# Patient Record
Sex: Male | Born: 1955 | Race: White | Hispanic: No | Marital: Married | Smoking: Current some day smoker
Health system: Southern US, Community
[De-identification: ages and names within clinical notes are randomized; demographics above are authoritative.]

## PROBLEM LIST (undated history)

## (undated) ENCOUNTER — Ambulatory Visit (INDEPENDENT_AMBULATORY_CARE_PROVIDER_SITE_OTHER): Admission: RE | Payer: Self-pay | Admitting: Internal Medicine

## (undated) DIAGNOSIS — G4730 Sleep apnea, unspecified: Secondary | ICD-10-CM

## (undated) DIAGNOSIS — E119 Type 2 diabetes mellitus without complications: Secondary | ICD-10-CM

## (undated) DIAGNOSIS — K219 Gastro-esophageal reflux disease without esophagitis: Secondary | ICD-10-CM

## (undated) DIAGNOSIS — C801 Malignant (primary) neoplasm, unspecified: Secondary | ICD-10-CM

## (undated) DIAGNOSIS — G4733 Obstructive sleep apnea (adult) (pediatric): Secondary | ICD-10-CM

## (undated) DIAGNOSIS — T7840XA Allergy, unspecified, initial encounter: Secondary | ICD-10-CM

## (undated) DIAGNOSIS — G4700 Insomnia, unspecified: Secondary | ICD-10-CM

## (undated) DIAGNOSIS — E039 Hypothyroidism, unspecified: Secondary | ICD-10-CM

## (undated) DIAGNOSIS — I10 Essential (primary) hypertension: Secondary | ICD-10-CM

## (undated) DIAGNOSIS — G43909 Migraine, unspecified, not intractable, without status migrainosus: Secondary | ICD-10-CM

## (undated) HISTORY — PX: HERNIA REPAIR: SHX51

## (undated) HISTORY — PX: TOTAL KNEE ARTHROPLASTY: SHX125

## (undated) HISTORY — DX: Obstructive sleep apnea (adult) (pediatric): G47.33

## (undated) HISTORY — DX: Allergy, unspecified, initial encounter: T78.40XA

## (undated) HISTORY — DX: Malignant (primary) neoplasm, unspecified: C80.1

## (undated) HISTORY — DX: Hypothyroidism, unspecified: E03.9

## (undated) HISTORY — DX: Insomnia, unspecified: G47.00

## (undated) HISTORY — DX: Essential (primary) hypertension: I10

## (undated) HISTORY — DX: Migraine, unspecified, not intractable, without status migrainosus: G43.909

---
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
## 1988-12-05 DIAGNOSIS — E785 Hyperlipidemia, unspecified: Secondary | ICD-10-CM

## 1988-12-05 HISTORY — PX: NASAL SEPTUM SURGERY: SHX37

## 1988-12-05 HISTORY — DX: Hyperlipidemia, unspecified: E78.5

## 1999-12-06 HISTORY — PX: PROSTATECTOMY: SHX69

## 2001-12-05 HISTORY — PX: PROSTATECTOMY: SHX69

## 2005-12-05 DIAGNOSIS — I4510 Unspecified right bundle-branch block: Secondary | ICD-10-CM

## 2005-12-05 HISTORY — PX: JOINT REPLACEMENT: SHX530

## 2005-12-05 HISTORY — DX: Unspecified right bundle-branch block: I45.10

## 2006-12-05 DIAGNOSIS — K648 Other hemorrhoids: Secondary | ICD-10-CM

## 2006-12-05 DIAGNOSIS — K5790 Diverticulosis of intestine, part unspecified, without perforation or abscess without bleeding: Secondary | ICD-10-CM

## 2006-12-05 HISTORY — DX: Other hemorrhoids: K64.8

## 2006-12-05 HISTORY — DX: Diverticulosis of intestine, part unspecified, without perforation or abscess without bleeding: K57.90

## 2010-06-08 ENCOUNTER — Ambulatory Visit
Admit: 2010-06-08 | Disposition: A | Discharge status: Home / Self Care | Payer: Self-pay | Source: Ambulatory Visit | Admitting: Family Medicine

## 2010-06-08 ENCOUNTER — Inpatient Hospital Stay
Admit: 2010-06-08 | Disposition: A | Discharge status: Home / Self Care | Payer: Self-pay | Source: Ambulatory Visit | Admitting: Family Medicine

## 2010-12-17 ENCOUNTER — Ambulatory Visit
Admit: 2010-12-17 | Disposition: A | Discharge status: Home / Self Care | Payer: Self-pay | Source: Ambulatory Visit | Admitting: Internal Medicine

## 2011-03-14 ENCOUNTER — Encounter (INDEPENDENT_AMBULATORY_CARE_PROVIDER_SITE_OTHER): Payer: Self-pay | Admitting: Internal Medicine

## 2011-03-28 ENCOUNTER — Ambulatory Visit (INDEPENDENT_AMBULATORY_CARE_PROVIDER_SITE_OTHER): Payer: BLUE CROSS/BLUE SHIELD | Admitting: Internal Medicine

## 2011-05-23 ENCOUNTER — Encounter (INDEPENDENT_AMBULATORY_CARE_PROVIDER_SITE_OTHER): Payer: Self-pay | Admitting: Internal Medicine

## 2011-05-23 ENCOUNTER — Other Ambulatory Visit (INDEPENDENT_AMBULATORY_CARE_PROVIDER_SITE_OTHER): Payer: Self-pay | Admitting: Internal Medicine

## 2011-05-23 DIAGNOSIS — R059 Cough, unspecified: Secondary | ICD-10-CM

## 2011-05-23 MED ORDER — CHLORPHENIRAMINE-HYDROCODONE 8-10 MG/5ML PO LQCR
5.00 mL | Freq: Two times a day (BID) | Status: DC | PRN
Start: 2011-05-23 — End: 2012-08-03

## 2011-05-23 NOTE — Telephone Encounter (Signed)
Prescription is at [facility] to pick up

## 2011-06-29 ENCOUNTER — Encounter (INDEPENDENT_AMBULATORY_CARE_PROVIDER_SITE_OTHER): Payer: Self-pay | Admitting: Internal Medicine

## 2011-06-30 ENCOUNTER — Ambulatory Visit (INDEPENDENT_AMBULATORY_CARE_PROVIDER_SITE_OTHER): Payer: BLUE CROSS/BLUE SHIELD | Admitting: Internal Medicine

## 2011-06-30 ENCOUNTER — Encounter (INDEPENDENT_AMBULATORY_CARE_PROVIDER_SITE_OTHER): Payer: Self-pay | Admitting: Internal Medicine

## 2011-06-30 DIAGNOSIS — E039 Hypothyroidism, unspecified: Secondary | ICD-10-CM

## 2011-06-30 DIAGNOSIS — G4733 Obstructive sleep apnea (adult) (pediatric): Secondary | ICD-10-CM

## 2011-06-30 DIAGNOSIS — I4510 Unspecified right bundle-branch block: Secondary | ICD-10-CM | POA: Insufficient documentation

## 2011-06-30 DIAGNOSIS — I10 Essential (primary) hypertension: Secondary | ICD-10-CM

## 2011-06-30 DIAGNOSIS — E785 Hyperlipidemia, unspecified: Secondary | ICD-10-CM

## 2011-06-30 DIAGNOSIS — E669 Obesity, unspecified: Secondary | ICD-10-CM

## 2011-06-30 DIAGNOSIS — E119 Type 2 diabetes mellitus without complications: Secondary | ICD-10-CM | POA: Insufficient documentation

## 2011-06-30 DIAGNOSIS — G4700 Insomnia, unspecified: Secondary | ICD-10-CM

## 2011-06-30 LAB — POCT URINALYSIS DIPSTIX (10)(MULTI-TEST)
Bilirubin, UA POCT: NEGATIVE
Blood, UA POCT: NEGATIVE
Glucose, UA POCT: NEGATIVE mg/dL
Ketones, UA POCT: NEGATIVE
Nitrite, UA POCT: NEGATIVE
POCT Leukocytes, UA: NEGATIVE
POCT Protein, UA: NEGATIVE
POCT Spec Gravity, UA: 1.015 (ref 1.001–1.035)
POCT Urobilinogen,Urine: 0.2 mg/dL (ref ?–1)
POCT pH, UA: 6 (ref 5–8)

## 2011-06-30 MED ORDER — ZOLPIDEM TARTRATE 10 MG PO TABS
10.00 mg | ORAL_TABLET | Freq: Every evening | ORAL | Status: DC | PRN
Start: 2011-06-30 — End: 2011-12-23

## 2011-06-30 MED ORDER — INSULIN PEN NEEDLE 32G X 6 MM MISC
Status: DC
Start: 2011-06-30 — End: 2012-02-17

## 2011-06-30 MED ORDER — ZOLPIDEM TARTRATE 10 MG PO TABS
10.00 mg | ORAL_TABLET | Freq: Every evening | ORAL | Status: DC | PRN
Start: 2011-06-30 — End: 2011-06-30

## 2011-06-30 NOTE — Progress Notes (Signed)
Subjective: DM, HTN, Hyperlipidemia F?u       Patient ID: Carl Gibbs is a 55 y.o. male.    HPI  Came in for DM, HTN, hyperlipidemia f/u. His FBS, BP have been stable. He tried diet and exercise to loose weight but never been successful. Interested in bariatric surgery, especially lap band surgery.      The following portions of the patient's history were reviewed and updated as appropriate: allergies, current medications, past family history, past medical history, past social history, past surgical history and problem list.      Past Medical History   Diagnosis Date   . Internal hemorrhoids 2008   . Diverticulosis 2008     by colonoscopy   . Allergy      ragweed,leaf,mold,dust,seasonal,beestings   . Diabetes mellitus 2006     type 2   . Cancer 2001-2002     prostate   . Hypertension    . Hyperlipidemia 1990   . Migraine syndrome    . Hypothyroidism    . Insomnia    . OSA (obstructive sleep apnea)    . Incomplete RBBB 2007       Past Surgical History   Procedure Date   . Hernia repair 1970's   . Prostatectomy 2001   . Nasal septum surgery 1990   . Joint replacement 2007     knee replacements,both       History     Social History   . Marital Status: Married     Spouse Name: N/A     Number of Children: N/A   . Years of Education: N/A     Occupational History   . Not on file.     Social History Main Topics   . Smoking status: Never Smoker    . Smokeless tobacco: Never Used   . Alcohol Use: Yes   . Drug Use: No   . Sexually Active:      Other Topics Concern   . Not on file     Social History Narrative   . No narrative on file       History reviewed. No pertinent family history.    Current outpatient prescriptions:atorvastatin (LIPITOR) 40 MG tablet, Take 40 mg by mouth daily.  , Disp: , Rfl: ;  fexofenadine (ALLEGRA) 30 MG tablet, Take 30 mg by mouth 2 (two) times daily.  , Disp: , Rfl: ;  fluticasone (FLONASE) 50 MCG/ACT nasal spray, 1 spray by Nasal route daily.  , Disp: , Rfl: ;  Lancets Thin MISC, by Does not  apply route.  , Disp: , Rfl:   levothyroxine (SYNTHROID, LEVOTHROID) 100 MCG tablet, Take 100 mcg by mouth daily.  , Disp: , Rfl: ;  Liraglutide (VICTOZA) 18 MG/3ML SOLN, Inject into the skin.  , Disp: , Rfl: ;  olmesartan (BENICAR) 20 MG tablet, Take 20 mg by mouth daily.  , Disp: , Rfl: ;  propranolol (INDERAL) 10 MG tablet, Take 10 mg by mouth as needed.  , Disp: , Rfl:   zolpidem (AMBIEN) 10 MG tablet, Take 1 tablet (10 mg total) by mouth nightly as needed., Disp: 90 tablet, Rfl: 3;  DISCONTD: zolpidem (AMBIEN) 10 MG tablet, Take 10 mg by mouth nightly as needed.  , Disp: , Rfl: ;  DISCONTD: zolpidem (AMBIEN) 10 MG tablet, Take 1 tablet (10 mg total) by mouth nightly as needed., Disp: 30 tablet, Rfl: 0  CHLORPHENIRAMINE-HYDROCODONE 8-10 MG/5ML PO LQCR, Take 5 mLs by mouth every  12 (twelve) hours as needed., Disp: 115 mL, Rfl: 0;  Insulin Pen Needle 32G X 6 MM MISC, Once a day with Victoza, Disp: 100 each, Rfl: 6      Review of Systems   Constitutional: Negative for fever, chills, diaphoresis, activity change, appetite change, fatigue and unexpected weight change.   Respiratory: Negative for cough, choking, chest tightness, shortness of breath, wheezing and stridor.    Cardiovascular: Negative for chest pain, palpitations and leg swelling.   Gastrointestinal: Negative for nausea, vomiting, abdominal pain, diarrhea, constipation, blood in stool, abdominal distention, anal bleeding and rectal pain.   Neurological: Negative for dizziness, weakness, light-headedness, numbness and headaches.   Hematological: Negative for adenopathy. Does not bruise/bleed easily.           Objective:    Physical Exam   Constitutional: He is oriented to person, place, and time. He appears well-developed and well-nourished. No distress.   HENT:   Head: Normocephalic and atraumatic.   Neck: No JVD present.   Cardiovascular: Normal rate, regular rhythm, normal heart sounds and intact distal pulses.  Exam reveals no gallop and no friction  rub.    No murmur heard.  Pulmonary/Chest: Effort normal and breath sounds normal. No respiratory distress. He has no wheezes. He has no rales. He exhibits no tenderness.   Abdominal: Soft. Bowel sounds are normal. He exhibits no distension and no mass. There is no tenderness. There is no rebound and no guarding.   Musculoskeletal: He exhibits no edema.   Neurological: He is alert and oriented to person, place, and time.   Skin: Skin is warm. He is not diaphoretic.   Psychiatric: His behavior is normal.           Assessment:       1. Hyperlipidemia    2. Diabetes mellitus type II    3. Hypothyroidism    4. Hypertension    5. Insomnia    6. OSA (obstructive sleep apnea)    7. Obesity          Plan:       1. Tests  Orders Placed This Encounter   Procedures   . Lipid panel   . Comprehensive metabolic panel   . TSH   . T4, free   . CK   . POCT urinalysis dipstick   2. Refill of Victoza needle, Ambien  3. Ref Bariatric surgeon  4. Continue current medicines  5. F/U: q 3 months and pending test results

## 2011-07-01 ENCOUNTER — Other Ambulatory Visit (INDEPENDENT_AMBULATORY_CARE_PROVIDER_SITE_OTHER): Payer: Self-pay | Admitting: Internal Medicine

## 2011-07-01 DIAGNOSIS — E119 Type 2 diabetes mellitus without complications: Secondary | ICD-10-CM

## 2011-07-01 DIAGNOSIS — K7689 Other specified diseases of liver: Secondary | ICD-10-CM

## 2011-07-01 LAB — LIPID PANEL
Cholesterol / HDL Ratio: 3.4 ratio units (ref 0.0–5.0)
Cholesterol: 131 mg/dL (ref 100–199)
HDL: 38 mg/dL — ABNORMAL LOW (ref >39)
LDL Calculated: 81 mg/dL (ref 0–99)
Triglycerides: 62 mg/dL (ref 0–149)
VLDL Calculated: 12 mg/dL (ref 5–40)

## 2011-07-01 LAB — COMPREHENSIVE METABOLIC PANEL
ALT: 83 IU/L — ABNORMAL HIGH (ref 0–55)
AST (SGOT): 47 IU/L — ABNORMAL HIGH (ref 0–40)
Albumin/Globulin Ratio: 1.4 (ref 1.1–2.5)
Albumin: 4 g/dL (ref 3.5–5.5)
Alkaline Phosphatase: 62 IU/L (ref 25–150)
BUN / Creatinine Ratio: 24 — ABNORMAL HIGH (ref 9–20)
BUN: 17 mg/dL (ref 6–24)
Bilirubin, Total: 0.5 mg/dL (ref 0.0–1.2)
CO2: 22 mmol/L (ref 20–32)
Calcium: 8.8 mg/dL (ref 8.7–10.2)
Chloride: 103 mmol/L (ref 97–108)
Creatinine: 0.7 mg/dL — ABNORMAL LOW (ref 0.76–1.27)
EGFR: 121 mL/min/{1.73_m2} (ref >59)
GFR Modification of Diet in Renal Disease Non-African American: 105 mL/min/{1.73_m2} (ref >59)
Globulin, Total: 2.8 g/dL (ref 1.5–4.5)
Glucose: 93 mg/dL (ref 65–99)
Potassium: 4.4 mmol/L (ref 3.5–5.2)
Protein, Total: 6.8 g/dL (ref 6.0–8.5)
Sodium: 139 mmol/L (ref 134–144)

## 2011-07-01 LAB — CK: Creatinine Kinase, Total: 96 U/L (ref 24–204)

## 2011-07-01 LAB — TSH: TSH: 0.918 u[IU]/mL (ref 0.450–4.500)

## 2011-07-01 LAB — T4, FREE: T4 Free: 1.23 ng/dL (ref 0.82–1.77)

## 2011-07-02 LAB — HEMOGLOBIN A1C: Hemoglobin A1C: 6 % — ABNORMAL HIGH (ref 4.8–5.6)

## 2011-07-04 ENCOUNTER — Encounter (INDEPENDENT_AMBULATORY_CARE_PROVIDER_SITE_OTHER): Payer: Self-pay | Admitting: Internal Medicine

## 2011-07-18 ENCOUNTER — Encounter (INDEPENDENT_AMBULATORY_CARE_PROVIDER_SITE_OTHER): Payer: Self-pay | Admitting: Internal Medicine

## 2011-07-18 ENCOUNTER — Ambulatory Visit (INDEPENDENT_AMBULATORY_CARE_PROVIDER_SITE_OTHER): Payer: BLUE CROSS/BLUE SHIELD | Admitting: Internal Medicine

## 2011-07-18 VITALS — BP 121/82 | HR 71 | Temp 98.3°F | Ht 64.0 in | Wt 226.4 lb

## 2011-07-18 DIAGNOSIS — E669 Obesity, unspecified: Secondary | ICD-10-CM

## 2011-07-18 DIAGNOSIS — G4730 Sleep apnea, unspecified: Secondary | ICD-10-CM

## 2011-07-18 NOTE — Progress Notes (Signed)
Subjective: pre op medical clearance.        Patient ID: Carl Gibbs is a 55 y.o. male.    HPI  Came in for gastric sleeve surgery consultation. He was required to have 4 months of pre op program including Nutritional, Gastroenterology and Psychiatry consultation, EGD and Sleep study. Required to loose 20 lbs prior to the surgery. Requests a letter to state that he is a candidate for the surgery.      CPAP setting was lowered in 03/2011. Since then he ca not sleep well as the pressure was too low. Requests pressure adjustment.    Past Medical History   Diagnosis Date   . Internal hemorrhoids 2008   . Diverticulosis 2008     by colonoscopy   . Allergy      ragweed,leaf,mold,dust,seasonal,beestings   . Diabetes mellitus 2006     type 2   . Cancer 2001-2002     prostate   . Hypertension    . Hyperlipidemia 1990   . Migraine syndrome    . Hypothyroidism    . Insomnia    . OSA (obstructive sleep apnea)    . Incomplete RBBB 2007       Past Surgical History   Procedure Date   . Hernia repair 1970's   . Prostatectomy 2001   . Nasal septum surgery 1990   . Joint replacement 2007     knee replacements,both       History     Social History   . Marital Status: Married     Spouse Name: N/A     Number of Children: N/A   . Years of Education: N/A     Occupational History   . Not on file.     Social History Main Topics   . Smoking status: Never Smoker    . Smokeless tobacco: Never Used   . Alcohol Use: Yes   . Drug Use: No   . Sexually Active:      Other Topics Concern   . Not on file     Social History Narrative   . No narrative on file       History reviewed. No pertinent family history.    Current outpatient prescriptions:amoxicillin (AMOXIL) 500 MG capsule, , Disp: , Rfl: ;  atorvastatin (LIPITOR) 40 MG tablet, Take 40 mg by mouth daily.  , Disp: , Rfl: ;  chlorhexidine (PERIDEX) 0.12 % solution, , Disp: , Rfl: ;  CHLORPHENIRAMINE-HYDROCODONE 8-10 MG/5ML PO LQCR, Take 5 mLs by mouth every 12 (twelve) hours as needed., Disp:  115 mL, Rfl: 0;  fexofenadine (ALLEGRA) 30 MG tablet, Take 30 mg by mouth 2 (two) times daily.  , Disp: , Rfl:   fluticasone (FLONASE) 50 MCG/ACT nasal spray, 1 spray by Nasal route daily.  , Disp: , Rfl: ;  HYDROcodone-ibuprofen (VICOPROFEN) 7.5-200 MG per tablet, , Disp: , Rfl: ;  ibuprofen (ADVIL,MOTRIN) 800 MG tablet, , Disp: , Rfl: ;  Insulin Pen Needle 32G X 6 MM MISC, Once a day with Victoza, Disp: 100 each, Rfl: 6;  Lancets Thin MISC, by Does not apply route.  , Disp: , Rfl:   levothyroxine (SYNTHROID, LEVOTHROID) 100 MCG tablet, Take 100 mcg by mouth daily.  , Disp: , Rfl: ;  Liraglutide (VICTOZA) 18 MG/3ML SOLN, Inject into the skin.  , Disp: , Rfl: ;  olmesartan (BENICAR) 20 MG tablet, Take 20 mg by mouth daily.  , Disp: , Rfl: ;  PATANASE 0.6 %  SOLN, , Disp: , Rfl: ;  propranolol (INDERAL) 10 MG tablet, Take 10 mg by mouth as needed.  , Disp: , Rfl: ;  SF 1.1 % GEL, , Disp: , Rfl:   zolpidem (AMBIEN) 10 MG tablet, Take 1 tablet (10 mg total) by mouth nightly as needed., Disp: 90 tablet, Rfl: 3         The following portions of the patient's history were reviewed and updated as appropriate: allergies, current medications, past family history, past medical history, past social history, past surgical history and problem list.    Review of Systems   Constitutional: Negative for fever, chills, diaphoresis, activity change, appetite change, fatigue and unexpected weight change.   Respiratory: Negative for apnea, cough, choking, chest tightness, shortness of breath, wheezing and stridor.    Cardiovascular: Negative for chest pain, palpitations and leg swelling.   Gastrointestinal: Negative.    Neurological: Negative for dizziness, light-headedness and headaches.   Psychiatric/Behavioral: Positive for sleep disturbance.           Objective:    Physical Exam   Constitutional: He is oriented to person, place, and time. He appears well-developed and well-nourished. No distress.   HENT:   Head: Normocephalic and  atraumatic.   Neurological: He is alert and oriented to person, place, and time.   Skin: He is not diaphoretic.   Psychiatric: He has a normal mood and affect. His behavior is normal.           Assessment:       1. Obesity    2. Sleep apnea          Plan:       1. Contact Pam, [phone] a surgical coordinator to release a form to sign  2. Contact Sleep study to review the pressure  3. Continue all the medications  4. F/U: pt will be required medical clearance examination and BW

## 2011-07-19 ENCOUNTER — Telehealth (INDEPENDENT_AMBULATORY_CARE_PROVIDER_SITE_OTHER): Payer: Self-pay | Admitting: Internal Medicine

## 2011-07-19 NOTE — Telephone Encounter (Signed)
Spoke to pt. Regarding CPAP, pt. Stated we need to fax an order to [facility] to adjust the settings of his machine. Pt. Stated last night he adjusted the air flow to 13 and was able to sleep well with feeling tired or headache in the morning. Pt. Stated he wants the setting to 12, and does not want to have another sleep study. [facility] # [id number] [phone] fax [phone].Thanks.

## 2011-07-19 NOTE — Telephone Encounter (Signed)
Fax [facility] to [facility]

## 2011-07-19 NOTE — Telephone Encounter (Signed)
Order is at [facility]

## 2011-08-01 ENCOUNTER — Other Ambulatory Visit (INDEPENDENT_AMBULATORY_CARE_PROVIDER_SITE_OTHER): Payer: Self-pay | Admitting: Internal Medicine

## 2011-08-01 DIAGNOSIS — K7689 Other specified diseases of liver: Secondary | ICD-10-CM

## 2011-08-01 DIAGNOSIS — E119 Type 2 diabetes mellitus without complications: Secondary | ICD-10-CM

## 2011-08-06 DIAGNOSIS — K449 Diaphragmatic hernia without obstruction or gangrene: Secondary | ICD-10-CM

## 2011-08-06 HISTORY — DX: Diaphragmatic hernia without obstruction or gangrene: K44.9

## 2011-08-18 ENCOUNTER — Encounter (INDEPENDENT_AMBULATORY_CARE_PROVIDER_SITE_OTHER): Payer: Self-pay | Admitting: Internal Medicine

## 2011-09-02 ENCOUNTER — Ambulatory Visit: Payer: Self-pay

## 2011-09-02 ENCOUNTER — Encounter (INDEPENDENT_AMBULATORY_CARE_PROVIDER_SITE_OTHER): Payer: Self-pay | Admitting: Internal Medicine

## 2011-09-02 HISTORY — PX: ESOPHAGOGASTRODUODENOSCOPY: SHX1529

## 2011-09-05 DIAGNOSIS — K2210 Ulcer of esophagus without bleeding: Secondary | ICD-10-CM

## 2011-09-05 HISTORY — DX: Ulcer of esophagus without bleeding: K22.10

## 2011-09-09 ENCOUNTER — Encounter (INDEPENDENT_AMBULATORY_CARE_PROVIDER_SITE_OTHER): Payer: Self-pay | Admitting: Internal Medicine

## 2011-09-14 ENCOUNTER — Encounter (INDEPENDENT_AMBULATORY_CARE_PROVIDER_SITE_OTHER): Payer: Self-pay | Admitting: Internal Medicine

## 2011-09-27 ENCOUNTER — Encounter (INDEPENDENT_AMBULATORY_CARE_PROVIDER_SITE_OTHER): Payer: Self-pay | Admitting: Internal Medicine

## 2011-09-29 ENCOUNTER — Encounter (INDEPENDENT_AMBULATORY_CARE_PROVIDER_SITE_OTHER): Payer: Self-pay | Admitting: Internal Medicine

## 2011-09-30 ENCOUNTER — Ambulatory Visit (INDEPENDENT_AMBULATORY_CARE_PROVIDER_SITE_OTHER): Payer: BLUE CROSS/BLUE SHIELD | Admitting: Internal Medicine

## 2011-09-30 ENCOUNTER — Encounter (INDEPENDENT_AMBULATORY_CARE_PROVIDER_SITE_OTHER): Payer: Self-pay | Admitting: Internal Medicine

## 2011-09-30 VITALS — BP 99/69 | HR 73 | Temp 98.8°F | Ht 64.0 in | Wt 227.1 lb

## 2011-09-30 DIAGNOSIS — K5730 Diverticulosis of large intestine without perforation or abscess without bleeding: Secondary | ICD-10-CM | POA: Insufficient documentation

## 2011-09-30 DIAGNOSIS — Z01818 Encounter for other preprocedural examination: Secondary | ICD-10-CM

## 2011-09-30 DIAGNOSIS — K449 Diaphragmatic hernia without obstruction or gangrene: Secondary | ICD-10-CM | POA: Insufficient documentation

## 2011-09-30 DIAGNOSIS — K2210 Ulcer of esophagus without bleeding: Secondary | ICD-10-CM | POA: Insufficient documentation

## 2011-09-30 DIAGNOSIS — M25521 Pain in right elbow: Secondary | ICD-10-CM

## 2011-09-30 DIAGNOSIS — I10 Essential (primary) hypertension: Secondary | ICD-10-CM

## 2011-09-30 DIAGNOSIS — E669 Obesity, unspecified: Secondary | ICD-10-CM

## 2011-09-30 DIAGNOSIS — E119 Type 2 diabetes mellitus without complications: Secondary | ICD-10-CM

## 2011-09-30 DIAGNOSIS — M25529 Pain in unspecified elbow: Secondary | ICD-10-CM

## 2011-09-30 DIAGNOSIS — E785 Hyperlipidemia, unspecified: Secondary | ICD-10-CM

## 2011-09-30 MED ORDER — NAPROXEN 375 MG PO TBEC
375.00 mg | DELAYED_RELEASE_TABLET | Freq: Two times a day (BID) | ORAL | Status: DC
Start: 2011-09-30 — End: 2011-10-21

## 2011-09-30 NOTE — Progress Notes (Signed)
Subjective: medical clearance , right elbow pain       Patient ID: Carl Gibbs is a 55 y.o. male.    HPI  Came in for a medical clearance for a bariatric surgery on 11/16/11. He had all clearance including EGD, Sleep study, nutrition consultation and psychiatric consultation. Needs to have cardiology consultation, CXR, GB USG, ECG and BW at [facility]. No chest pain, palpitation, dyspnea or headaches    C/o right elbow pain for several days. No injury, swelling, numbness or weakness. The pain is worse when lifting or carrying.     The following portions of the patient's history were reviewed and updated as appropriate: allergies, current medications, past family history, past medical history, past social history, past surgical history and problem list.      Past Medical History   Diagnosis Date   . Internal hemorrhoids 2008   . Diverticulosis 2008     by colonoscopy   . Allergy      ragweed,leaf,mold,dust,seasonal,beestings   . Diabetes mellitus 2006     type 2   . Cancer 2001-2002     prostate   . Hypertension    . Hyperlipidemia 1990   . Migraine syndrome    . Hypothyroidism    . Insomnia    . OSA (obstructive sleep apnea)    . Incomplete RBBB 2007   . Hiatal hernia 08/2011   . Erosive esophagitis 09/2011       Past Surgical History   Procedure Date   . Hernia repair 1970's   . Prostatectomy 2001   . Nasal septum surgery 1990   . Joint replacement 2007     knee replacements,both   . Esophagogastroduodenoscopy 09/02/11     hiatal hernia`       History reviewed. No pertinent family history.    History     Social History   . Marital Status: Married     Spouse Name: N/A     Number of Children: N/A   . Years of Education: N/A     Occupational History   . Not on file.     Social History Main Topics   . Smoking status: Never Smoker    . Smokeless tobacco: Never Used   . Alcohol Use: Yes   . Drug Use: No   . Sexually Active:      Other Topics Concern   . Not on file     Social History Narrative   . No narrative on  file       Current outpatient prescriptions:atorvastatin (LIPITOR) 40 MG tablet, Take 40 mg by mouth daily.  , Disp: , Rfl: ;  fexofenadine (ALLEGRA) 30 MG tablet, Take 30 mg by mouth 2 (two) times daily.  , Disp: , Rfl: ;  FLUZONE injection, , Disp: , Rfl: ;  Insulin Pen Needle 32G X 6 MM MISC, Once a day with Victoza, Disp: 100 each, Rfl: 6;  Lancets Thin MISC, by Does not apply route.  , Disp: , Rfl:   levothyroxine (SYNTHROID, LEVOTHROID) 100 MCG tablet, Take 100 mcg by mouth daily.  , Disp: , Rfl: ;  Liraglutide (VICTOZA) 18 MG/3ML SOLN, Inject into the skin.  , Disp: , Rfl: ;  olmesartan (BENICAR) 20 MG tablet, Take 20 mg by mouth daily.  , Disp: , Rfl: ;  omeprazole (PRILOSEC) 40 MG capsule, , Disp: , Rfl: ;  OMNARIS 50 MCG/ACT nasal spray, , Disp: , Rfl: ;  PATANASE 0.6 % SOLN, ,  Disp: , Rfl:   propranolol (INDERAL) 10 MG tablet, Take 10 mg by mouth as needed.  , Disp: , Rfl: ;  zolpidem (AMBIEN) 10 MG tablet, Take 1 tablet (10 mg total) by mouth nightly as needed., Disp: 90 tablet, Rfl: 3;  amoxicillin (AMOXIL) 500 MG capsule, , Disp: , Rfl: ;  chlorhexidine (PERIDEX) 0.12 % solution, , Disp: , Rfl: ;  CHLORPHENIRAMINE-HYDROCODONE 8-10 MG/5ML PO LQCR, Take 5 mLs by mouth every 12 (twelve) hours as needed., Disp: 115 mL, Rfl: 0  fluticasone (FLONASE) 50 MCG/ACT nasal spray, 1 spray by Nasal route daily.  , Disp: , Rfl: ;  HYDROcodone-ibuprofen (VICOPROFEN) 7.5-200 MG per tablet, , Disp: , Rfl: ;  ibuprofen (ADVIL,MOTRIN) 800 MG tablet, , Disp: , Rfl: ;  Naproxen (NAPROXEN) 375 MG TBEC, Take 375 mg by mouth 2 (two) times daily., Disp: 60 each, Rfl: 0;  SF 1.1 % GEL, , Disp: , Rfl:       Review of Systems   Constitutional: Negative for fever, chills, diaphoresis, activity change, appetite change, fatigue and unexpected weight change.   HENT: Negative for neck pain and neck stiffness.    Respiratory: Negative for apnea, cough, choking, chest tightness, shortness of breath, wheezing and stridor.     Cardiovascular: Negative for chest pain, palpitations and leg swelling.   Gastrointestinal: Negative.    Musculoskeletal: Positive for arthralgias. Negative for myalgias, back pain, joint swelling and gait problem.        Right elbow pain   Neurological: Negative for dizziness, weakness, light-headedness, numbness and headaches.   Hematological: Negative for adenopathy.   Psychiatric/Behavioral: Positive for disturbed wake/sleep cycle. The patient is not nervous/anxious.         Sleep apnea on CPAP           Objective:    Physical Exam  BP 99/69  Pulse 73  Temp(Src) 98.8 F (37.1 C) (Oral)  Ht 1.626 m (5\' 4" )  Wt 103.023 kg (227 lb 2 oz)  BMI 38.99 kg/m2    General Appearance:    Alert, cooperative, no distress, appears stated age   Head:    Normocephalic, without obvious abnormality, atraumatic   Eyes:    PERRL, conjunctiva/corneas clear, EOM's intact, fundi     benign, both eyes        Ears:    Normal TM's and external ear canals, both ears   Nose:   Nares normal, septum midline, mucosa normal, no drainage    or sinus tenderness   Throat:   Lips, mucosa, and tongue normal; teeth and gums normal   Neck:   Supple, symmetrical, trachea midline, no adenopathy;        thyroid:  No enlargement/tenderness/nodules; no carotid    bruit or JVD   Back:     Symmetric, no curvature, ROM normal, no CVA tenderness   Lungs:     Clear to auscultation bilaterally, respirations unlabored   Chest wall:    No tenderness or deformity   Heart:    Regular rate and rhythm, S1 and S2 normal, no murmur, rub    or gallop   Abdomen:     Soft, non-tender, bowel sounds active all four quadrants,     no masses, no organomegaly   Extremities:   Atraumatic, no cyanosis or edema, tenderness at right medial epicondyle area   Pulses:   2+ and symmetric all extremities   Skin:   Skin color, texture, turgor normal, no rashes or lesions   Lymph  nodes:   Cervical, supraclavicular, and axillary nodes normal   Neurologic:   CNII-XII intact. Normal  strength, sensation and reflexes       throughout         Assessment:       1. Preoperative examination, unspecified    2. Diabetes mellitus type II    3. Benign hypertension    4. Hyperlipidemia    5. Obesity    6. Right elbow pain          Plan:       1. He is medically cleared on examination to proceed with a surgery.   2. F/u his pre ope test results  3. Naproxen EC 375 mg bid prn for elbow pain  4. Avoid heavy lifting or carrying  5. F/U: q 3 months.

## 2011-10-06 DIAGNOSIS — Z136 Encounter for screening for cardiovascular disorders: Secondary | ICD-10-CM

## 2011-10-06 HISTORY — DX: Encounter for screening for cardiovascular disorders: Z13.6

## 2011-10-19 ENCOUNTER — Encounter (INDEPENDENT_AMBULATORY_CARE_PROVIDER_SITE_OTHER): Payer: Self-pay | Admitting: Internal Medicine

## 2011-10-20 ENCOUNTER — Encounter (INDEPENDENT_AMBULATORY_CARE_PROVIDER_SITE_OTHER): Payer: Self-pay | Admitting: Internal Medicine

## 2011-10-20 ENCOUNTER — Ambulatory Visit: Admit: 2011-10-20 | Disposition: A | Discharge status: Home / Self Care | Payer: Self-pay | Source: Ambulatory Visit

## 2011-10-20 LAB — CBC AND DIFFERENTIAL
Basophils Absolute Automated: 0.03 10*3/uL (ref 0.00–0.20)
Basophils Automated: 0 % (ref 0–2)
Eosinophils Absolute Automated: 0.37 10*3/uL (ref 0.00–0.70)
Eosinophils Automated: 5 % (ref 0–5)
Hematocrit: 47.4 % (ref 42.0–52.0)
Hgb: 16.1 g/dL (ref 13.0–17.0)
Immature Granulocytes Absolute: 0.01 10*3/uL
Immature Granulocytes: 0 % (ref 0–1)
Lymphocytes Absolute Automated: 1.61 10*3/uL (ref 0.50–4.40)
Lymphocytes Automated: 20 % (ref 15–41)
MCH: 29.5 pg (ref 28.0–32.0)
MCHC: 34 g/dL (ref 32.0–36.0)
MCV: 87 fL (ref 80.0–100.0)
MPV: 9.2 fL — ABNORMAL LOW (ref 9.4–12.3)
Monocytes Absolute Automated: 0.87 10*3/uL (ref 0.00–1.20)
Monocytes: 11 % (ref 0–11)
Neutrophils Absolute: 5.21 10*3/uL (ref 1.80–8.10)
Neutrophils: 64 % (ref 52–75)
Nucleated RBC: 0 /100 WBC
Platelets: 223 10*3/uL (ref 140–400)
RBC: 5.45 10*6/uL (ref 4.70–6.00)
RDW: 13 % (ref 12–15)
WBC: 8.09 10*3/uL (ref 3.50–10.80)

## 2011-10-20 LAB — COMPREHENSIVE METABOLIC PANEL
ALT: 62 U/L — ABNORMAL HIGH (ref 7–56)
AST (SGOT): 36 U/L (ref 5–40)
Albumin/Globulin Ratio: 1.3 (ref 1.1–1.8)
Albumin: 4.4 g/dL (ref 3.7–5.1)
Alkaline Phosphatase: 83 U/L (ref 43–122)
Anion Gap: 14 (ref 5.0–15.0)
BUN: 15 mg/dL (ref 7–21)
Bilirubin, Total: 0.6 mg/dL (ref 0.2–1.3)
CO2: 28 mEq/L (ref 22–31)
Calcium: 9.5 mg/dL (ref 8.6–10.2)
Chloride: 102 mEq/L (ref 98–107)
Creatinine: 0.8 mg/dL (ref 0.5–1.4)
Globulin: 3.3 g/dL (ref 2.0–3.7)
Glucose: 101 mg/dL — ABNORMAL HIGH (ref 70–100)
Potassium: 4.9 mEq/L (ref 3.6–5.0)
Protein, Total: 7.7 g/dL (ref 6.0–8.0)
Sodium: 144 mEq/L — ABNORMAL HIGH (ref 136–143)

## 2011-10-20 LAB — LIPID PANEL
Cholesterol / HDL Ratio: 5.2 Index
Cholesterol: 140 mg/dL (ref 0–199)
HDL: 27 mg/dL — ABNORMAL LOW (ref >40)
LDL Calculated: 91 mg/dL (ref 0–99)
Triglycerides: 108 mg/dL (ref 34–149)
VLDL Calculated: 22 mg/dL (ref 10–40)

## 2011-10-20 LAB — URINALYSIS WITH MICROSCOPIC
Bilirubin, UA: NEGATIVE
Blood, UA: NEGATIVE
Glucose, UA: NEGATIVE
Ketones UA: NEGATIVE
Leukocyte Esterase, UA: NEGATIVE
Nitrite, UA: NEGATIVE
Protein, UR: NEGATIVE
Specific Gravity UA POCT: 1.015 (ref 1.001–1.035)
Urine pH: 6 (ref 5.0–8.0)
Urobilinogen, UA: NORMAL mg/dL

## 2011-10-20 LAB — APTT: PTT: 28 s (ref 23–37)

## 2011-10-20 LAB — GFR: EGFR: >60

## 2011-10-20 LAB — H. PYLORI ANTIBODY, IGG: Helicobacter Pylori Antibody: NEGATIVE

## 2011-10-20 LAB — TSH: TSH: 1.55 u[IU]/mL (ref 0.35–4.94)

## 2011-10-20 LAB — PT/INR
PT INR: 1 (ref 0.9–1.1)
PT: 13.6 s (ref 12.6–15.0)

## 2011-10-20 LAB — HEMOLYSIS INDEX: Hemolysis Index: 11 Index — ABNORMAL HIGH (ref 0–9)

## 2011-10-21 ENCOUNTER — Other Ambulatory Visit (INDEPENDENT_AMBULATORY_CARE_PROVIDER_SITE_OTHER): Payer: Self-pay | Admitting: Internal Medicine

## 2011-10-21 LAB — VITAMIN D-25 HYDROXY (D2/D3/TOTAL)
Vitamin D 25-OH D2: <4 ng/mL
Vitamin D 25-OH D3: 27 ng/mL
Vitamin D 25-OH Total: 27 ng/mL — ABNORMAL LOW (ref 30–100)

## 2011-11-16 ENCOUNTER — Inpatient Hospital Stay
Admission: RE | Admit: 2011-11-16 | Disposition: A | Discharge status: Home / Self Care | Payer: Self-pay | Source: Ambulatory Visit | Attending: Surgery | Admitting: Surgery

## 2011-11-16 HISTORY — PX: BARIATRIC SURGERY: SHX1103

## 2011-11-16 LAB — TYPE AND SCREEN
AB Screen Gel: NEGATIVE
ABO Rh: O NEG

## 2011-11-17 LAB — COMPREHENSIVE METABOLIC PANEL
ALT: 94 U/L — ABNORMAL HIGH (ref 7–56)
AST (SGOT): 55 U/L — ABNORMAL HIGH (ref 5–40)
Albumin/Globulin Ratio: 1.2 (ref 1.1–1.8)
Albumin: 3.4 g/dL — ABNORMAL LOW (ref 3.7–5.1)
Alkaline Phosphatase: 59 U/L (ref 43–122)
Anion Gap: 13 (ref 5.0–15.0)
BUN: 12 mg/dL (ref 7–21)
Bilirubin, Total: 0.4 mg/dL (ref 0.2–1.3)
CO2: 23 mEq/L (ref 22–31)
Calcium: 8.2 mg/dL — ABNORMAL LOW (ref 8.6–10.2)
Chloride: 103 mEq/L (ref 98–107)
Creatinine: 0.7 mg/dL (ref 0.5–1.4)
Globulin: 2.9 g/dL (ref 2.0–3.7)
Glucose: 109 mg/dL — ABNORMAL HIGH (ref 70–100)
Potassium: 4.2 mEq/L (ref 3.6–5.0)
Protein, Total: 6.3 g/dL (ref 6.0–8.0)
Sodium: 139 mEq/L (ref 136–143)

## 2011-11-17 LAB — GFR: EGFR: >60

## 2011-11-17 LAB — CBC
Hematocrit: 39 % — ABNORMAL LOW (ref 42.0–52.0)
Hgb: 13.4 g/dL (ref 13.0–17.0)
MCH: 29.9 pg (ref 28.0–32.0)
MCHC: 34.4 g/dL (ref 32.0–36.0)
MCV: 87.1 fL (ref 80.0–100.0)
MPV: 9.3 fL — ABNORMAL LOW (ref 9.4–12.3)
Nucleated RBC: 0 /100 WBC
Platelets: 187 10*3/uL (ref 140–400)
RBC: 4.48 10*6/uL — ABNORMAL LOW (ref 4.70–6.00)
RDW: 13 % (ref 12–15)
WBC: 9.45 10*3/uL (ref 3.50–10.80)

## 2011-11-17 LAB — MAGNESIUM: Magnesium: 1.8 mg/dL (ref 1.6–2.3)

## 2011-11-17 LAB — CK: Creatine Kinase (CK): 119 U/L (ref 0–297)

## 2011-11-17 LAB — PHOSPHORUS: Phosphorus: 4.3 mg/dL (ref 2.5–4.5)

## 2011-11-18 LAB — LAB USE ONLY - HISTORICAL SURGICAL PATHOLOGY

## 2011-11-22 NOTE — Procedures (Signed)
[facility]      ,            Transthoracic Echocardiogram      2D, M-mode, Doppler, and Color Doppler      Study date:  08-Jun-2010            Patient: Carl Gibbs      MR #: [id number]      Account #: [id number]      DOB: 01-07-1956      Age: 55 years      Gender: Male      Height: 65 in      Weight: 224.6 lb      BSA: 2.08 m            Sonographer:  Williemae Natter, RDCS      Reading Cardiologist-AM:  Cyndee Brightly, MD            CLINICAL QUESTION: SOB            HISTORY: PRIOR HISTORY: HTN, HLD, DM II, prostate cancer, BP 118/78            PROCEDURE: The procedure was performed in the echo lab. This was a routine      study. The transthoracic approach was used. The study included complete 2D      imaging, M-mode, complete spectral Doppler, and color Doppler. Image quality      was adequate.            SYSTEM MEASUREMENT TABLES            2D      %FS: 31.7 %      Ao Diam: 3.4 cm      EF(Teich): 59.4 %      ESV(Teich): 48.3 ml      IVSd: 1.1 cm      LA Diam: 4 cm      LVIDd: 5 cm      LVIDs: 3.4 cm      LVPWd: 0.8 cm      RVIDd: 3.2 cm      SV(Teich): 70.7 ml            CW      AV Env.Ti: 282.8 ms      AV VTI: 33.7 cm      AV Vmax: 170.1 cm/s      AV Vmean: 119.1 cm/s      AV maxPG: 11.6 mmHg      AV meanPG: 6.2 mmHg      PV Vmax: 97.4 cm/s      PV maxPG: 3.8 mmHg      TR Vmax: 260 cm/s      TR maxPG: 27 mmHg            PW      AVA (VTI): 3.3 cm2      AVA Vmax: 3.6 cm2      HR: 208.1 BPM      LVOT Env.Ti: 288.4 ms      LVOT VTI: 24.7 cm      LVOT Vmax: 134.8 cm/s      LVOT Vmean: 85.7 cm/s      LVOT maxPG: 7.3 mmHg      LVOT meanPG: 3.4 mmHg      MV A Vel: 67.8 cm/s      MV DecT: 192.9 ms      MV E Vel: 89.4 cm/s      MV E/A Ratio: 1.3      MV PHT: 55.9 ms  MV dec slope: 4.6 m/s2      MVA By PHT: 3.9 cm2      RVOT Vmax: 65.4 cm/s      RVOT maxPG: 1.7 mmHg            LEFT VENTRICLE: Size was normal. Systolic function was normal. Ejection      fraction was estimated to be 65 %. There were no regional wall motion       abnormalities. Wall thickness was normal. Doppler: The ratio of early      ventricular filling to atrial contraction velocities was within the normal      range.            VENTRICULAR SEPTUM: Thickness was normal. There was normal motion. There was      normal contour. The septum was intact.            AORTIC VALVE: The valve was trileaflet. Leaflets exhibited normal thickness and      normal cuspal separation. Doppler: Transaortic velocity was within the normal      range. There was no stenosis. There was no regurgitation.            AORTA: The root exhibited normal size.            MITRAL VALVE: Valve structure was normal. There was normal leaflet separation.      Doppler: The transmitral velocity was within the normal range. There was no      evidence for stenosis. There was trivial regurgitation.            LEFT ATRIUM: Size was normal.            ATRIAL SEPTUM: No defect or patent foramen ovale was identified.            RIGHT VENTRICLE: The size was normal. Systolic function was normal. Wall      thickness was normal. Doppler: Estimated peak pressure was 30 mmHg.            PULMONIC VALVE: Doppler: There was trivial regurgitation.            PULMONARY ARTERY: The size was normal.            TRICUSPID VALVE: The valve structure was normal. Doppler: There was mild      regurgitation.            SYSTEMIC VEINS: IVC: The inferior vena cava was normal in size and course.      Respirophasic changes were normal.            PERICARDIUM: There was no pericardial effusion. The pericardium was normal in      appearance.            SUMMARY:            -  Left ventricle:      -  Size was normal.      -  Systolic function was normal. Ejection fraction was estimated to be 65 %.      -  There were no regional wall motion abnormalities.      -  Wall thickness was normal.      -  The ratio of early ventricular filling to atrial contraction velocities was      within the normal range.            -  Aortic valve:      -  The  valve was trileaflet. Leaflets exhibited normal thickness and normal  cuspal separation.      -  Transaortic velocity was within the normal range.      -  There was no stenosis.      -  There was no regurgitation.            -  Mitral valve:      -  Valve structure was normal.      -  There was normal leaflet separation.      -  The transmitral velocity was within the normal range.      -  There was no evidence for stenosis.      -  There was trivial regurgitation.            -  Right ventricle:      -  Estimated peak pressure was 30 mmHg.            -  Pericardium:      -  There was no pericardial effusion.            Prepared and signed by            Cyndee Brightly, MD      Signed 08-Jun-2010 17:18:20                  (N: [id number])

## 2011-11-23 NOTE — Op Note (Signed)
GIORDANO, GETMAN                                        MRN:          [id number]                                                          Account:      [id number]                                         Document ID:  [id number]                                                   Procedure Date: 11/16/2011                                                                                    Admit Date: 11/16/2011     Patient Location: K509-01  Patient Type: I     SURGEON: Governor Rooks MD  ASSISTANT:        ASSISTANT:  _____     PREOPERATIVE DIAGNOSES:  Morbid obesity with comorbid conditions.     POSTOPERATIVE DIAGNOSES:  1.  Morbid obesity with comorbid conditions.  2.  Hiatal hernia.     TITLE OF PROCEDURE:  1.  Laparoscopic vertical sleeve gastrectomy.  2.  Laparoscopic hiatal hernia repair.  3.  Placement of dual tunneled On-Q pain medication delivery catheters.     ANESTHESIA:  General.     BLOOD LOSS:  Minimal.     HISTORY:  The patient is a 55 year old morbidly obese male, preoperative weight 233  pounds, body mass index of 40, with significant comorbid conditions.  The  patient elected to undergo laparoscopic vertical sleeve gastrectomy.  Procedure, risks, and complications were discussed, including bleeding,  infection, injury to intraabdominal organs, as well as staple line leak,  stricture, long-term complications, reflux, and failure to lose weight were  explained.  Written material was provided.  The patient fully understood  and gave informed consent for the procedure.     DESCRIPTION OF PROCEDURE:  The patient was placed on the OR table in supine position.  General  anesthesia was given.  Abdomen prepped and draped in the usual manner.  Peritoneal cavity  carefully entered under direct vision through the supraumbilical midline  Page 1 of 3  STEPHANIE, MCGLONE                                        MRN:           [id number]                                                          Account:      [id number]                                         Document ID:  [id number]                                                   Procedure Date: 11/16/2011                                                                                    using a Visiport.  Insufflation with carbon dioxide to 15-mmHg pressure  done.  Laparoscope was inserted.  Visual exploration revealed moderate  hepatosplenomegaly.  Liver retractor was placed.  Left lobe of the liver  elevated.  Trocars were placed, 5 mm trocars, right and left subcostal  incisions, left mid abdominal incisions, 12-mm trocar through a right mid  abdominal incision.  The patient was placed in steep reverse Trendelenburg  position.  Gastroesophageal junction was visualized.  There was a hiatal  hernia noted.  The peritoneum over angle of His was incised and the left  crus was identified.  The incision was then carried over anteriorly and the  right crus was exposed.  Herniated adipose tissue was reduced.  A 36-French  bougie was then carefully placed.  An anterior crural repair was done using  0 silk figure-of-8 sutures.  The crura were approximated in front of the  esophagus while keeping the bougie in the lumen to calibrate the repair.  Repair was inspected.  There was no bleeding noted.  Pylorus was then  identified and the gastrocolic ligament was entered 4 cm proximal to the  pylorus.  Retrogastric space was opened.  Using the LigaSure device, the  gastrocolic and gastrosplenic ligaments were divided adjacent to the  greater curvature of the stomach, proceeding along up to angle of His.  Posterior attachments of the fundus were also carefully lysed until full  mobilization of the fundus was done.  A vertical sleeve resection was then  done using an Echelon flex stapler with black stapler load and Peri-Strips  starting 4 cm proximal to the pylorus.  This was followed by green  staple  loads with Peri-Strips.  The resection was carried along the  bougie which  was very gently guided against the lesser curvature of the stomach.  Last  firing of the stapler was done just lateral to the gastroesophageal  junction and lateral to the fat pad.  Staple line was inspected.  No  bleeding was noted.  Staple line was then oversewn with figure-of-eight 2-0  silk sutures at each individual intersection of the staple loads.  These  sutures incorporated the gastrocolic omentum providing additional coverage  to the staple line.  The bougie was then removed.  Nasogastric tube was  inserted.  An air leak test was done.  The pylorus was crossclamped then  stomach was insufflated with air via the NG tube while emersed under  saline.  Staple lines were visualized.  There was no air leak identified.  Further irrigation was done.  Hemostasis was checked.  The liver retractor  was removed under direct vision.  Dual tunneled On-Q pain medication  catheters were placed.  One catheter was tunneled along the left costal  margin in the preperitoneal space under laparoscopic visualization.  A  second catheter was tunneled along the right costal margin in the  preperitoneal space under laparoscopic visualization.  Catheters were  secured to the skin and connected to the medication delivery system.  Peritoneal cavity was then deflated, trocars were removed.  The resected  stomach was extracted through the supraumbilical incision.  The incisions  were then infiltrated with 0.5% Marcaine with epinephrine, irrigated,  closed with 0 Vicryl and 4-0 Monocryl.  Blood loss was minimal.  The  patient tolerated the procedure well.                                                                                                           Page 2 of 3  REYNALDO, ROSSMAN                                        MRN:          [id number]                                                          Account:      [id number]                                          Document ID:  [id number]                                                   Procedure Date: 11/16/2011  D:  11/16/2011 19:01 PM by Dr. Particia Lather. Josha Weekley, MD [phone])  T:  11/17/2011 07:32 AM by [id number]        cc:                                                                                                           Page 3 of 3  Authenticated by Particia Lather. Deaisha Welborn, MD [phone]) On 11/23/2011 03:37:37 PM

## 2011-11-24 ENCOUNTER — Other Ambulatory Visit (INDEPENDENT_AMBULATORY_CARE_PROVIDER_SITE_OTHER): Payer: Self-pay | Admitting: Internal Medicine

## 2011-11-24 ENCOUNTER — Ambulatory Visit (INDEPENDENT_AMBULATORY_CARE_PROVIDER_SITE_OTHER): Payer: BLUE CROSS/BLUE SHIELD | Admitting: Internal Medicine

## 2011-11-24 ENCOUNTER — Encounter (INDEPENDENT_AMBULATORY_CARE_PROVIDER_SITE_OTHER): Payer: Self-pay | Admitting: Internal Medicine

## 2011-11-24 DIAGNOSIS — Z2911 Encounter for prophylactic immunotherapy for respiratory syncytial virus (RSV): Secondary | ICD-10-CM

## 2011-11-24 DIAGNOSIS — E039 Hypothyroidism, unspecified: Secondary | ICD-10-CM

## 2011-11-24 DIAGNOSIS — E119 Type 2 diabetes mellitus without complications: Secondary | ICD-10-CM

## 2011-11-24 DIAGNOSIS — R7402 Elevation of levels of lactic acid dehydrogenase (LDH): Secondary | ICD-10-CM

## 2011-11-24 DIAGNOSIS — R748 Abnormal levels of other serum enzymes: Secondary | ICD-10-CM

## 2011-11-24 LAB — POCT GLYCOSYLATED HEMOGLOBIN (HGB A1C): POCT Hgb A1C: 5.4 (ref 3.9–5.9)

## 2011-11-24 MED ORDER — AMOXICILLIN 500 MG PO CAPS
500.00 mg | ORAL_CAPSULE | Freq: Three times a day (TID) | ORAL | Status: DC
Start: 2011-11-24 — End: 2012-02-17

## 2011-11-24 NOTE — Telephone Encounter (Signed)
LM to let patient know his medication was approved and sent to pharmacy.

## 2011-11-24 NOTE — Telephone Encounter (Signed)
Patient going to have dental work in Feb. 2013 and forgot to ask if you could call in some Amoxil. Patient states he always takes an AB before he has dental work done.

## 2011-11-24 NOTE — Progress Notes (Signed)
Subjective:       Patient ID: Carl Gibbs is a 55 y.o. male.    HPI  Came in for surgery follow up. Has lost 14 lbs before a surgery as he was clear liquid diet. Still on Lovenox. The pain is under control. Still on liquid diet.     The following portions of the patient's history were reviewed and updated as appropriate: allergies, current medications, past family history, past medical history, past social history, past surgical history and problem list.      Past Medical History   Diagnosis Date   . Internal hemorrhoids 2008   . Diverticulosis 2008     by colonoscopy   . Allergy      ragweed,leaf,mold,dust,seasonal,beestings   . Diabetes mellitus 2006     type 2   . Cancer 2001-2002     prostate   . Hypertension    . Hyperlipidemia 1990   . Migraine syndrome    . Hypothyroidism    . Insomnia    . OSA (obstructive sleep apnea)    . Incomplete RBBB 2007   . Hiatal hernia 08/2011   . Erosive esophagitis 09/2011   . Treadmill stress test negative for angina pectoris 10/2011       Past Surgical History   Procedure Date   . Hernia repair 1970's   . Prostatectomy 2001   . Nasal septum surgery 1990   . Joint replacement 2007     knee replacements,both   . Esophagogastroduodenoscopy 09/02/11     hiatal hernia`   . Bariatric surgery 11/16/11       History reviewed. No pertinent family history.    History     Social History   . Marital Status: Married     Spouse Name: N/A     Number of Children: N/A   . Years of Education: N/A     Occupational History   . Not on file.     Social History Main Topics   . Smoking status: Never Smoker    . Smokeless tobacco: Never Used   . Alcohol Use: Yes   . Drug Use: No   . Sexually Active:      Other Topics Concern   . Not on file     Social History Narrative   . No narrative on file       Current outpatient prescriptions:CHLORPHENIRAMINE-HYDROCODONE 8-10 MG/5ML PO LQCR, Take 5 mLs by mouth every 12 (twelve) hours as needed., Disp: 115 mL, Rfl: 0;  enoxaparin (LOVENOX) 40 MG/0.4ML SOLN, ,  Disp: , Rfl: ;  fexofenadine (ALLEGRA) 30 MG tablet, Take 30 mg by mouth 2 (two) times daily.  , Disp: , Rfl: ;  ibuprofen (ADVIL,MOTRIN) 800 MG tablet, , Disp: , Rfl:   levothyroxine (SYNTHROID, LEVOTHROID) 100 MCG tablet, Take 100 mcg by mouth daily.  , Disp: , Rfl: ;  OMNARIS 50 MCG/ACT nasal spray, , Disp: , Rfl: ;  PATANASE 0.6 % SOLN, , Disp: , Rfl: ;  propranolol (INDERAL) 10 MG tablet, Take 10 mg by mouth as needed.  , Disp: , Rfl: ;  ursodiol (ACTIGALL) 500 MG tablet, , Disp: , Rfl: ;  zolpidem (AMBIEN) 10 MG tablet, Take 1 tablet (10 mg total) by mouth nightly as needed., Disp: 90 tablet, Rfl: 3  amoxicillin (AMOXIL) 500 MG capsule, , Disp: , Rfl: ;  atorvastatin (LIPITOR) 40 MG tablet, Take 40 mg by mouth daily.  , Disp: , Rfl: ;  chlorhexidine (PERIDEX) 0.12 %  solution, , Disp: , Rfl: ;  fluticasone (FLONASE) 50 MCG/ACT nasal spray, 1 spray by Nasal route daily.  , Disp: , Rfl: ;  FLUZONE injection, , Disp: , Rfl: ;  HYDROcodone-acetaminophen (LORTAB) 7.5-500 MG/15ML solution, , Disp: , Rfl:   HYDROcodone-ibuprofen (VICOPROFEN) 7.5-200 MG per tablet, , Disp: , Rfl: ;  hyoscyamine (LEVSIN/SL) 0.125 MG SL tablet, , Disp: , Rfl: ;  Insulin Pen Needle 32G X 6 MM MISC, Once a day with Victoza, Disp: 100 each, Rfl: 6;  Lancets Thin MISC, by Does not apply route.  , Disp: , Rfl: ;  Liraglutide (VICTOZA) 18 MG/3ML SOLN, Inject into the skin.  , Disp: , Rfl:   naproxen (NAPROSYN) 375 MG tablet, TAKE 1 TABLET BY MOUTH TWICE A DAY, Disp: 60 tablet, Rfl: 6;  olmesartan (BENICAR) 20 MG tablet, Take 20 mg by mouth daily.  , Disp: , Rfl: ;  omeprazole (PRILOSEC) 40 MG capsule, , Disp: , Rfl: ;  ondansetron (ZOFRAN-ODT) 4 MG disintegrating tablet, , Disp: , Rfl: ;  PREVACID SOLUTAB 30 MG disintegrating tablet, , Disp: , Rfl: ;  SF 1.1 % GEL, , Disp: , Rfl:       Review of Systems   Constitutional: Positive for appetite change. Negative for fever, chills, diaphoresis, activity change and fatigue.   Respiratory: Negative  for cough, choking, chest tightness and shortness of breath.    Cardiovascular: Negative for chest pain, palpitations and leg swelling.   Gastrointestinal: Negative for nausea, abdominal pain, diarrhea and constipation.   Musculoskeletal: Negative for back pain and arthralgias.   Skin: Negative for rash.   Neurological: Negative for dizziness, weakness, numbness and headaches.           Objective:    Physical Exam   Constitutional: He is oriented to person, place, and time. He appears well-developed and well-nourished. No distress.   HENT:   Head: Normocephalic and atraumatic.   Neck: No JVD present.   Cardiovascular: Normal rate, regular rhythm, normal heart sounds and intact distal pulses.  Exam reveals no gallop and no friction rub.    No murmur heard.  Pulmonary/Chest: Effort normal. No stridor. No respiratory distress. He has no wheezes. He has no rales. He exhibits no tenderness.   Abdominal: There is tenderness.        Tenderness at lower abdominal surgical scar   Lymphadenopathy:     He has no cervical adenopathy.   Neurological: He is alert and oriented to person, place, and time.   Skin: Skin is warm. No rash noted. He is not diaphoretic.   Psychiatric: He has a normal mood and affect. His behavior is normal.           Assessment:       1. Elevated liver enzymes    2. Diabetes mellitus type II    3. Hypothyroidism          Plan:       1. Tests  Orders Placed This Encounter   Procedures   . Varicella-zoster vaccine subcutaneous   . Pneumococcal polysaccharide vaccine 23-valent greater than or equal to 2yo subcutaneous/IM   . Hepatic function panel   . Gamma GT   . Basic metabolic panel   . T4, free   . TSH   . POCT Glycosylated Hemoglobin (A1C)    5.4 %  2. Continue current medicines  3. F/U: q 3 months.

## 2011-11-25 LAB — HEPATIC FUNCTION PANEL
ALT: 64 IU/L — ABNORMAL HIGH (ref 0–44)
AST (SGOT): 39 IU/L (ref 0–40)
Albumin: 4.4 g/dL (ref 3.5–5.5)
Alkaline Phosphatase: 70 IU/L (ref 25–150)
Bilirubin Direct: 0.11 mg/dL (ref 0.00–0.40)
Bilirubin, Total: 0.3 mg/dL (ref 0.0–1.2)
Protein, Total: 7.9 g/dL (ref 6.0–8.5)

## 2011-11-25 LAB — BASIC METABOLIC PANEL
BUN / Creatinine Ratio: 19 (ref 9–20)
BUN: 18 mg/dL (ref 6–24)
CO2: 20 mmol/L (ref 20–32)
Calcium: 9.5 mg/dL (ref 8.7–10.2)
Chloride: 100 mmol/L (ref 97–108)
Creatinine: 0.93 mg/dL (ref 0.76–1.27)
EGFR: 105 mL/min/{1.73_m2} (ref >59)
EGFR: 91 mL/min/{1.73_m2} (ref >59)
Glucose: 98 mg/dL (ref 65–99)
Potassium: 4.5 mmol/L (ref 3.5–5.2)
Sodium: 138 mmol/L (ref 134–144)

## 2011-11-25 LAB — TSH: TSH: 1.18 u[IU]/mL (ref 0.450–4.500)

## 2011-11-25 LAB — T4, FREE: T4, Free: 1.61 ng/dL (ref 0.82–1.77)

## 2011-11-25 LAB — GAMMA GT: Gamma Gluten Transferase: 34 IU/L (ref 0–65)

## 2011-12-03 ENCOUNTER — Other Ambulatory Visit (INDEPENDENT_AMBULATORY_CARE_PROVIDER_SITE_OTHER): Payer: Self-pay | Admitting: Internal Medicine

## 2011-12-06 HISTORY — PX: LAPAROSCOPIC GASTRIC SLEEVE RESECTION: SHX5895

## 2011-12-07 ENCOUNTER — Telehealth (INDEPENDENT_AMBULATORY_CARE_PROVIDER_SITE_OTHER): Payer: Self-pay | Admitting: Internal Medicine

## 2011-12-07 ENCOUNTER — Other Ambulatory Visit (INDEPENDENT_AMBULATORY_CARE_PROVIDER_SITE_OTHER): Payer: Self-pay | Admitting: Internal Medicine

## 2011-12-07 MED ORDER — LEVOTHYROXINE SODIUM 100 MCG PO TABS
100.00 ug | ORAL_TABLET | Freq: Every day | ORAL | Status: DC
Start: 2011-12-07 — End: 2012-11-26

## 2011-12-07 NOTE — Telephone Encounter (Signed)
Patient states he is using .12 units every morning.

## 2011-12-07 NOTE — Telephone Encounter (Signed)
NEED VICTOZA DETAILS

## 2011-12-07 NOTE — Telephone Encounter (Signed)
NO SUCH DOSE   BETTER CLARIFY WITH PHARMACY

## 2011-12-07 NOTE — Telephone Encounter (Signed)
Patient of Dr. Moises Blood needs his medications refilled. He is on his last pen of Victoza.

## 2011-12-07 NOTE — Telephone Encounter (Signed)
On patient's Victoza, he uses .12 units every morning.

## 2011-12-08 NOTE — Telephone Encounter (Signed)
Spoke with pharmacist about dosage. He is taking 1.8 mg sub cutaneous daily.

## 2011-12-08 NOTE — Telephone Encounter (Signed)
Medications were refilled. Patient notified.

## 2011-12-08 NOTE — Telephone Encounter (Signed)
victoza called in to pharmacy, patient notified

## 2011-12-12 ENCOUNTER — Telehealth (INDEPENDENT_AMBULATORY_CARE_PROVIDER_SITE_OTHER): Payer: Self-pay | Admitting: Internal Medicine

## 2011-12-12 ENCOUNTER — Ambulatory Visit (INDEPENDENT_AMBULATORY_CARE_PROVIDER_SITE_OTHER): Payer: BLUE CROSS/BLUE SHIELD | Admitting: Internal Medicine

## 2011-12-12 ENCOUNTER — Encounter (INDEPENDENT_AMBULATORY_CARE_PROVIDER_SITE_OTHER): Payer: Self-pay | Admitting: Internal Medicine

## 2011-12-12 VITALS — BP 116/77 | HR 74 | Temp 97.6°F | Ht 64.0 in | Wt 197.0 lb

## 2011-12-12 DIAGNOSIS — J329 Chronic sinusitis, unspecified: Secondary | ICD-10-CM

## 2011-12-12 DIAGNOSIS — J069 Acute upper respiratory infection, unspecified: Secondary | ICD-10-CM

## 2011-12-12 MED ORDER — CLARITHROMYCIN 500 MG PO TABS
500.00 mg | ORAL_TABLET | Freq: Two times a day (BID) | ORAL | Status: AC
Start: 2011-12-12 — End: 2011-12-26

## 2011-12-12 MED ORDER — METHYLPREDNISOLONE (PAK) 4 MG PO TABS
4.00 mg | ORAL_TABLET | Freq: Every day | ORAL | Status: DC
Start: 2011-12-12 — End: 2012-02-17

## 2011-12-12 NOTE — Telephone Encounter (Signed)
BIAXIN MAY POTENTATE STATIN EFFECTS ONLY  NO CONTRAINDICATION  CAN TAKE IT    THANKS

## 2011-12-12 NOTE — Telephone Encounter (Signed)
Carl Gibbs with [facility] called saying there is a contraindication for patient taking Biaxin. It may interfere with his Atorvastatin. What would you like patient to do?

## 2011-12-12 NOTE — Telephone Encounter (Signed)
Pharmacy notified. Pharmacists states patient had stopped taking Lipitor for a couple months anyway.

## 2011-12-12 NOTE — Progress Notes (Signed)
Subjective:       Patient ID: Carl Gibbs is a 56 y.o. male.    Sinusitis  This is a new problem. The current episode started in the past 7 days. Associated symptoms include congestion and coughing. Pertinent negatives include no chills, diaphoresis, headaches, hoarse voice, shortness of breath, sneezing or sore throat. Past treatments include acetaminophen. The treatment provided mild relief.   Ear Fullness   There is pain in the right ear. This is a new problem. The current episode started in the past 7 days. Associated symptoms include coughing. Pertinent negatives include no abdominal pain, diarrhea, ear discharge, headaches, rhinorrhea, sore throat or vomiting.     SINUSITIS SX FOR 1 WEEK  R EAR DISCOMFORT    DOUBLE Z PACK AND MEDROL WORKED IN THE PAST    The following portions of the patient's history were reviewed and updated as appropriate: allergies, current medications, past family history, past medical history, past social history, past surgical history and problem list.    Review of Systems   Constitutional: Positive for fatigue. Negative for fever, chills, diaphoresis, activity change and appetite change.   HENT: Positive for congestion. Negative for sore throat, hoarse voice, rhinorrhea, sneezing and ear discharge.    Eyes: Negative for discharge.   Respiratory: Positive for cough. Negative for shortness of breath and wheezing.    Cardiovascular: Negative for chest pain, palpitations and leg swelling.   Gastrointestinal: Negative for nausea, vomiting, abdominal pain and diarrhea.   Skin: Negative for color change.   Neurological: Negative for dizziness and headaches.   Hematological: Negative for adenopathy.   Psychiatric/Behavioral: Negative for decreased concentration. The patient is not nervous/anxious.            Objective:    Physical Exam   Constitutional: He is oriented to person, place, and time. He appears well-developed and well-nourished. No distress.   HENT:   Head: Normocephalic and  atraumatic.   Right Ear: External ear normal.   Left Ear: External ear normal.   Nose: Nose normal.   Mouth/Throat: Oropharynx is clear and moist. No oropharyngeal exudate.   Eyes: Conjunctivae and EOM are normal. Pupils are equal, round, and reactive to light. Right eye exhibits no discharge. Left eye exhibits no discharge. No scleral icterus.   Neck: Normal range of motion. Neck supple. No thyromegaly present.   Cardiovascular: Normal rate, regular rhythm, normal heart sounds and intact distal pulses.    No murmur heard.  Pulmonary/Chest: Effort normal and breath sounds normal. No respiratory distress. He has no wheezes. He has no rales.   Abdominal: Soft. Bowel sounds are normal. He exhibits no distension. There is no tenderness.   Musculoskeletal: Normal range of motion. He exhibits no edema and no tenderness.   Lymphadenopathy:     He has no cervical adenopathy.   Neurological: He is alert and oriented to person, place, and time. No cranial nerve deficit. Coordination normal.   Skin: Skin is warm and dry. He is not diaphoretic. No pallor.   Psychiatric: He has a normal mood and affect. His behavior is normal. Judgment and thought content normal.           Assessment:       1. URI (upper respiratory infection)     2. Sinusitis  clarithromycin (BIAXIN) 500 MG tablet, methylPREDNIsolone (MEDROL DOSPACK) 4 MG tablet           Plan:       1. URI (upper respiratory infection)  2. Sinusitis  clarithromycin (BIAXIN) 500 MG tablet, methylPREDNIsolone (MEDROL DOSPACK) 4 MG tablet     Take medication as prescribed  Tylenol / Advil as needed; unless contraindicated  OTC med's as instructed  Rest and fluids

## 2011-12-13 ENCOUNTER — Ambulatory Visit (INDEPENDENT_AMBULATORY_CARE_PROVIDER_SITE_OTHER): Payer: BLUE CROSS/BLUE SHIELD | Admitting: Internal Medicine

## 2011-12-21 ENCOUNTER — Emergency Department
Admit: 2011-12-21 | Discharge: 2011-12-21 | Disposition: A | Discharge status: Home / Self Care | Payer: Self-pay | Source: Emergency Department | Admitting: Pediatric Emergency Medicine

## 2011-12-23 ENCOUNTER — Other Ambulatory Visit (INDEPENDENT_AMBULATORY_CARE_PROVIDER_SITE_OTHER): Payer: Self-pay | Admitting: Internal Medicine

## 2011-12-23 DIAGNOSIS — G4700 Insomnia, unspecified: Secondary | ICD-10-CM

## 2011-12-26 MED ORDER — ZOLPIDEM TARTRATE 10 MG PO TABS
10.00 mg | ORAL_TABLET | Freq: Every evening | ORAL | Status: DC | PRN
Start: 2011-12-23 — End: 2012-02-17

## 2011-12-28 NOTE — Discharge Summary (Signed)
Carl Gibbs, Carl Gibbs                                        MRN:          [id number]                                                          Account:      [id number]                                         Document ID:  [id number]                                                                                                                                        Admit Date: 11/16/2011  Discharge Date: 11/19/2011     ATTENDING PHYSICIAN:  Louisa Second, MD        ADMITTING DIAGNOSIS:  Morbid obesity with comorbid conditions.     DISCHARGE DIAGNOSIS:  Morbid obesity with comorbid conditions.     HISTORY:  The patient is a 56 year old morbidly obese male scheduled for elective  laparoscopic vertical sleeve gastrectomy.  The patient's history is  significant for hypertension, hyperlipidemia, type 2 diabetes, obstructive  sleep apnea.  The patient has attempted numerous diets without successful  weight loss and weight loss maintenance.  He elected to undergo vertical  sleeve gastrectomy.  He underwent extensive preoperative evaluation by  cardiology, internal medicine, as well as preoperative testing and dietary  counseling.  He gave informed consent for the procedure.     HOSPITAL COURSE:  On November 16, 2011 he underwent laparoscopic vertical sleeve gastrectomy  and hiatal hernia repair.  Procedure was done under general anesthesia.  He  tolerated the procedure well.  Postoperatively he was admitted for post-op  monitoring.  The first post-op day he underwent an upper GI Gastrografin  study, which showed no leak or obstruction.  He was started on a liquid  diet.  He continued however to experience significant nausea and difficulty  tolerating liquids, therefore he was continued on intravenous fluids and  antiemetics.  Gradually his nausea had resolved.  He was mobilized out of  bed and his diet was advanced to full liquids,.  On November 19, 2011, he  was afebrile, ambulating, tolerating full liquids.  His abdomen was  soft,  incisions were clean and dry.  He was voiding, passing flatus and had no  calf tenderness.  He was discharged home.  Detailed instructions for diet,  wound care,  activity were given.  Prescriptions for Lortab, Prevacid,  Zofran, and Lovenox were given.  Followup appointment in 10 days was  arranged.                 D:  12/22/2011 09:32 AM by Dr. Particia Lather. Danitza Schoenfeldt, MD [phone])  T:  12/22/2011 10:24 AM by [id number]                                                                                                           Page 1 of 2  LARNCE, SCHNACKENBERG                                        MRN:          [id number]                                                          Account:      [id number]                                         Document ID:  [id number]                                                                                                                                                 cc:                                                                                                           Page 2 of 2  Authenticated by Particia Lather. Dejanique Ruehl, MD [phone]) On 12/28/2011 02:31:13 PM

## 2012-02-04 ENCOUNTER — Encounter (INDEPENDENT_AMBULATORY_CARE_PROVIDER_SITE_OTHER): Payer: Self-pay | Admitting: Internal Medicine

## 2012-02-16 ENCOUNTER — Other Ambulatory Visit (INDEPENDENT_AMBULATORY_CARE_PROVIDER_SITE_OTHER): Payer: Self-pay

## 2012-02-17 ENCOUNTER — Encounter (INDEPENDENT_AMBULATORY_CARE_PROVIDER_SITE_OTHER): Payer: Self-pay | Admitting: Internal Medicine

## 2012-02-17 ENCOUNTER — Ambulatory Visit (INDEPENDENT_AMBULATORY_CARE_PROVIDER_SITE_OTHER): Payer: BLUE CROSS/BLUE SHIELD | Admitting: Internal Medicine

## 2012-02-17 ENCOUNTER — Other Ambulatory Visit (INDEPENDENT_AMBULATORY_CARE_PROVIDER_SITE_OTHER): Payer: Self-pay | Admitting: Internal Medicine

## 2012-02-17 VITALS — BP 115/80 | HR 67 | Temp 97.7°F | Ht 64.0 in | Wt 177.0 lb

## 2012-02-17 DIAGNOSIS — G4700 Insomnia, unspecified: Secondary | ICD-10-CM

## 2012-02-17 DIAGNOSIS — G8929 Other chronic pain: Secondary | ICD-10-CM

## 2012-02-17 DIAGNOSIS — M549 Dorsalgia, unspecified: Secondary | ICD-10-CM

## 2012-02-17 DIAGNOSIS — E039 Hypothyroidism, unspecified: Secondary | ICD-10-CM

## 2012-02-17 DIAGNOSIS — F411 Generalized anxiety disorder: Secondary | ICD-10-CM

## 2012-02-17 DIAGNOSIS — F418 Other specified anxiety disorders: Secondary | ICD-10-CM

## 2012-02-17 DIAGNOSIS — Z8639 Personal history of other endocrine, nutritional and metabolic disease: Secondary | ICD-10-CM

## 2012-02-17 DIAGNOSIS — Z9884 Bariatric surgery status: Secondary | ICD-10-CM

## 2012-02-17 DIAGNOSIS — E785 Hyperlipidemia, unspecified: Secondary | ICD-10-CM

## 2012-02-17 MED ORDER — NAPROXEN 375 MG PO TABS
375.00 mg | ORAL_TABLET | Freq: Every day | ORAL | Status: DC | PRN
Start: 2012-02-17 — End: 2012-08-07

## 2012-02-17 MED ORDER — PROPRANOLOL HCL 10 MG PO TABS
10.00 mg | ORAL_TABLET | ORAL | Status: DC | PRN
Start: 2012-02-16 — End: 2012-02-17

## 2012-02-17 MED ORDER — ZOLPIDEM TARTRATE 10 MG PO TABS
10.00 mg | ORAL_TABLET | Freq: Every evening | ORAL | Status: DC | PRN
Start: 2012-02-17 — End: 2012-08-03

## 2012-02-17 MED ORDER — PROPRANOLOL HCL 10 MG PO TABS
40.00 mg | ORAL_TABLET | ORAL | Status: DC | PRN
Start: 2012-02-17 — End: 2012-11-26

## 2012-02-18 LAB — TSH: TSH: 0.971 u[IU]/mL (ref 0.450–4.500)

## 2012-02-21 ENCOUNTER — Encounter (INDEPENDENT_AMBULATORY_CARE_PROVIDER_SITE_OTHER): Payer: Self-pay | Admitting: Internal Medicine

## 2012-02-21 DIAGNOSIS — Z9884 Bariatric surgery status: Secondary | ICD-10-CM | POA: Insufficient documentation

## 2012-02-21 MED ORDER — GLUCOSE BLOOD VI STRP
ORAL_STRIP | Status: AC
Start: 2012-02-17 — End: ?

## 2012-02-21 NOTE — Progress Notes (Signed)
Subjective:       Patient ID: Carl Gibbs is a 56 y.o. male.  Chief Complaint   Patient presents with   . Follow-up   . Hypothyroidism   . Medication Refill       HPI  Here today for follow-up of hypothyroidism and medication refill.  Patient had gastric sleeve placement in 11/2011 and states he has lost about 56 lbs since then.  He had labs done earlier this month (ordered by his bariatric center) and brings the results with him.      Chronic back pain - Well controlled on naproxen 375mg  daily as needed.  Denies abdominal pain, melena, or hematochezia.    Insomnia - Still has some difficulty sleeping.  Uses Ambien 10mg  at night when needed.  Also using CPAP for sleep apnea; thinks pressure is too high since he has lost weight.  Would like to wait to get polysomnogram until he reaches his goal weight (about 25 lbs more of weight loss).      Hypothyroidism - Reports compliance with levothyroxine daily.      Recent h/o gastric sleeve placement - Followed closely by bariatric center.    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  Takes propranolol as needed for performance anxiety, which works well, per patient.      The following portions of the patient's history were reviewed and updated as appropriate: allergies, current medications, past medical history, past surgical history and problem list.    Review of Systems   Constitutional: Positive for fatigue.   Eyes: Negative for visual disturbance.   Respiratory: Negative for chest tightness and shortness of breath.    Cardiovascular: Negative for chest pain.   Gastrointestinal: Positive for constipation.   Neurological: Negative for dizziness, light-headedness and headaches.   Psychiatric/Behavioral: Positive for disturbed wake/sleep cycle.         Objective:    Physical Exam   Constitutional: He is oriented to person, place, and time. He appears well-developed. No distress.   HENT:   Head: Normocephalic.   Eyes: Conjunctivae are normal. Right eye exhibits  no discharge. Left eye exhibits no discharge. No scleral icterus.   Cardiovascular: Normal rate, regular rhythm and normal heart sounds.  Exam reveals no gallop.    No murmur heard.  Pulmonary/Chest: Effort normal and breath sounds normal. No respiratory distress.   Neurological: He is alert and oriented to person, place, and time.   Skin: Skin is warm and dry. No rash noted. He is not diaphoretic.   Psychiatric: He has a normal mood and affect. His behavior is normal.     BP 115/80  Pulse 67  Temp(Src) 97.7 F (36.5 C) (Oral)  Ht 1.626 m (5\' 4" )  Wt 80.287 kg (177 lb)  BMI 30.38 kg/m2    Labs done 02/08/2012 reviewed:  WBC 7.6  Hemoglobin 15.1  Platelets 227    A1C 5.5%    BUN 18  Creatinine 0.57    Electrolytes normal    Iron 102  Transferrin 219  Ferritin 72    Vitamin B12 948  Folate 17.4    Vitamin A - 40    Vitamin B1 - 16.1    Cholesterol 186  Triglycerides 70  HDL 32    PTH 21  LDL 140      Assessment:       1. Hypothyroidism  TSH   2. Chronic back pain  naproxen (NAPROSYN) 375 MG tablet   3. Insomnia  zolpidem (  AMBIEN) 10 MG tablet   4. Performance anxiety  propranolol (INDERAL) 10 MG tablet   5. H/O gastric bypass             Plan:       1. Hypothyroidism - check TSH.  Continue levothyroxine daily for now.    2.  Chronic back pain - Stable on naproxen 375mg  daily as needed, continue.    3.  Insomnia - Patient to have repeat polysomnogram when he is close to goal weight.  Continue Ambien 10mg  as needed for now.     4.  Performance anxiety - Well controlled on propranolol as needed, continue.    5.  Recent gastric sleeve placement - Continue follow-up with bariatric center.     6.  Return to clinic in 2 months for follow-up of hypothyroidism.

## 2012-02-21 NOTE — Progress Notes (Signed)
Subjective:      Encounter opened in error     Patient ID: Carl Gibbs is a 56 y.o. male.  No chief complaint on file.      HPI      Review of Systems        Objective:    Physical Exam        Assessment:             Plan:

## 2012-02-23 ENCOUNTER — Telehealth (INDEPENDENT_AMBULATORY_CARE_PROVIDER_SITE_OTHER): Payer: Self-pay | Admitting: Internal Medicine

## 2012-02-23 NOTE — Telephone Encounter (Signed)
Left message letting patient know that TSH was well controlled.  Patient should continue the current dose of levothyroxine.

## 2012-04-16 ENCOUNTER — Other Ambulatory Visit (INDEPENDENT_AMBULATORY_CARE_PROVIDER_SITE_OTHER): Payer: Self-pay | Admitting: Internal Medicine

## 2012-04-16 DIAGNOSIS — Z87892 Personal history of anaphylaxis: Secondary | ICD-10-CM

## 2012-04-17 ENCOUNTER — Encounter (INDEPENDENT_AMBULATORY_CARE_PROVIDER_SITE_OTHER): Payer: Self-pay | Admitting: Internal Medicine

## 2012-04-18 MED ORDER — EPINEPHRINE 0.3 MG/0.3ML IJ DEVI
0.30 mg | Freq: Once | INTRAMUSCULAR | Status: DC
Start: 2012-04-16 — End: 2013-07-03

## 2012-04-18 NOTE — Telephone Encounter (Signed)
Prescription for Epipen has been sent to patient's preferred pharmacy

## 2012-04-20 ENCOUNTER — Encounter (INDEPENDENT_AMBULATORY_CARE_PROVIDER_SITE_OTHER): Payer: Self-pay | Admitting: Internal Medicine

## 2012-06-06 ENCOUNTER — Encounter (INDEPENDENT_AMBULATORY_CARE_PROVIDER_SITE_OTHER): Payer: Self-pay | Admitting: Internal Medicine

## 2012-06-06 ENCOUNTER — Ambulatory Visit (INDEPENDENT_AMBULATORY_CARE_PROVIDER_SITE_OTHER): Payer: BLUE CROSS/BLUE SHIELD | Admitting: Internal Medicine

## 2012-06-06 VITALS — BP 110/77 | HR 69 | Temp 98.0°F | Ht 63.5 in | Wt 169.4 lb

## 2012-06-06 DIAGNOSIS — G479 Sleep disorder, unspecified: Secondary | ICD-10-CM

## 2012-06-06 DIAGNOSIS — E039 Hypothyroidism, unspecified: Secondary | ICD-10-CM

## 2012-06-06 MED ORDER — TRAZODONE HCL 50 MG PO TABS
50.00 mg | ORAL_TABLET | Freq: Every evening | ORAL | Status: DC
Start: 2012-06-06 — End: 2012-06-14

## 2012-06-06 NOTE — Progress Notes (Signed)
Subjective:       Patient ID: Carl Gibbs is a 56 y.o. male.  Patient seen in clinic on June 06, 2012.    Chief Complaint   Patient presents with   . Follow-up     hypothyroidism   Difficulty sleeping    HPI  Here today for follow-up of hypothyroidism.  Patient states he continues to be followed by Dr. Nelda Severe (Bariatric Surgery) after his bariatric surgery over the last few months.  Of note, after the surgery, patient has been able to come off his medications for diabetes and hypertension.  Patient states he has been exercising regularly.     He states he has been getting routine labs with Dr. Vernice Jefferson (lipid panel, A1C, etc), which we have received copies of (see below).  His lipid panel was somewhat abnormal, but patient states he was not fasting and is scheduled for a f/u lipid panel with them.    Hypothyroidism - last TSH was done 02/2012 was 0.971.  Patient reports compliance with levothyroxine daily.  He does have some problems with constipation but tries to follow a high fiber diet.  He denies any excessive fatigue, dry skin or hair thinning.     Patient states he has difficulty maintaining sleep.  He has been taking Ambien in the past on a daily basis, which works well for him.      Obstructive sleep apnea - Has been using his CPAP machine with setting at 67mmH2O.  He does not want to have the sleep study repeated after surgery until he is able to lose his goal weight.      He denies any problems with frequent headaches, chest pain, shortness of breath, dyspnea on exertion, lower extremity edema, nausea, vomiting, or symptoms of claudication.      The following portions of the patient's history were reviewed and updated as appropriate: allergies, current medications, past medical history, past social history and problem list.    Past Medical History   Diagnosis Date   . Internal hemorrhoids 2008   . Diverticulosis 2008     by colonoscopy   . Allergy       ragweed,leaf,mold,dust,seasonal,beestings   . Diabetes mellitus 2006     type 2   . Cancer 2001-2002     prostate   . Hypertension    . Hyperlipidemia 1990   . Migraine syndrome    . Hypothyroidism    . Insomnia    . OSA (obstructive sleep apnea)    . Incomplete RBBB 2007   . Hiatal hernia 08/2011   . Erosive esophagitis 09/2011   . Treadmill stress test negative for angina pectoris 10/2011       History     Social History   . Marital Status: Married     Spouse Name: N/A     Number of Children: N/A   . Years of Education: N/A     Occupational History   . Not on file.     Social History Main Topics   . Smoking status: Never Smoker    . Smokeless tobacco: Never Used   . Alcohol Use: Yes   . Drug Use: No   . Sexually Active:      Other Topics Concern   . Not on file     Social History Narrative   . No narrative on file       No Known Allergies    Current Outpatient Prescriptions   Medication Sig  Dispense Refill   . ALPROSTADIL, BULK, POWD        . fexofenadine (ALLEGRA) 30 MG tablet Take 30 mg by mouth daily.        Marland Kitchen glucose blood test strip Contour glucose strips  100 each  1   . Lancets Thin MISC by Does not apply route.         Marland Kitchen levothyroxine (SYNTHROID, LEVOTHROID) 100 MCG tablet Take 1 tablet (100 mcg total) by mouth daily.  90 tablet  3   . naproxen (NAPROSYN) 375 MG tablet Take 1 tablet (375 mg total) by mouth daily as needed.  90 tablet  1   . omeprazole (PRILOSEC) 40 MG capsule        . OMNARIS 50 MCG/ACT nasal spray        . ondansetron (ZOFRAN-ODT) 4 MG disintegrating tablet        . PATANASE 0.6 % SOLN        . propranolol (INDERAL) 10 MG tablet Take 4 tablets (40 mg total) by mouth as needed (for performance anxiety).  90 tablet  2   . SF 1.1 % GEL        . zolpidem (AMBIEN) 10 MG tablet Take 1 tablet (10 mg total) by mouth nightly as needed.  90 tablet  0   . CHLORPHENIRAMINE-HYDROCODONE 8-10 MG/5ML PO LQCR Take 5 mLs by mouth every 12 (twelve) hours as needed.  115 mL  0       Review of Systems  Per  HPI      Objective:    Physical Exam   Constitutional: He is oriented to person, place, and time. He appears well-developed. No distress.        Overweight.   HENT:   Head: Normocephalic.   Eyes: Conjunctivae normal are normal. Right eye exhibits no discharge. Left eye exhibits no discharge. No scleral icterus.   Cardiovascular: Normal rate, regular rhythm and normal heart sounds.  Exam reveals no gallop.    No murmur heard.  Pulmonary/Chest: Effort normal and breath sounds normal. No respiratory distress.   Neurological: He is alert and oriented to person, place, and time.   Skin: Skin is warm and dry. No rash noted. He is not diaphoretic.   Psychiatric: He has a normal mood and affect. His behavior is normal.     BP 110/77  Pulse 69  Temp 98 F (36.7 C)  Ht 1.613 m (5' 3.5")  Wt 76.839 kg (169 lb 6.4 oz)  BMI 29.54 kg/m2    Labs done by Dr. Vernice Jefferson in 05/2012 were reviewed:  CMP acceptable  Iron panel acceptable  B12, folate, vitamin D, and vitamin A levels acceptable    Lipid panel: cholesterol 238, LDL 173, HDL 43, triglycerides 108    A1C 5.7%      Assessment:       1. Difficulty sleeping  DISCONTINUED: traZODONE (DESYREL) 50 MG tablet   2. Hypothyroidism           Plan:       1.  We have discussed with patient that Ambien is not approved for long-term use.  Patient agrees with a trial of trazodone 50mg  at night.  If the medication helps with sleep, he will contact us for a new prescription as only a trial was prescribed.     2.  Hypothyroidism - Well controlled on TSH in 02/2012.  We will continue current levothyroxine daily.  We will plan on checking a TSH during  patient's next visit.     3.  We have counseled patient on continued diet and exercise.    4.  Patient will follow-up with Dr. Vernice Jefferson for a repeat lipid panel.    5.  Risk & Benefits of the new medication(s) were explained to the patient (and family) who verbalized understanding & agreed to the treatment plan. Patient (family)  encouraged to contact me/clinical staff with any questions/concerns    6.  Patient will return to clinic in 6 months for follow-up of hypothyroidism.  He may return to clinic sooner as needed.      We have spent at least 50-% of this 25 minute office visit was spent counseling the patient on one or more of the following:    Disease state-   Discussion about the medical condition, diagnostic and treatment options  Medications-   Indications and side effects  Lifestyle issues such as diet, exercise, tobacco and alcohol use, sleep, stress, depression and anxiety

## 2012-06-06 NOTE — Patient Instructions (Signed)
Patient Education   Trazodone Hydrochloride Oral tablet    Trazodone Hydrochloride Oral tablet, extended release 24 hour   Trazodone Hydrochloride Oral tablet  What is this medicine?  TRAZODONE (TRAZ oh done) is used to treat depression.  This medicine may be used for other purposes; ask your health care provider or pharmacist if you have questions.  What should I tell my health care provider before I take this medicine?  They need to know if you have any of these conditions:   attempted suicide or thinking about it   bipolar disorder   bleeding problems   heart disease, or previous heart attack   irregular heart beat   kidney or liver disease   low levels of sodium in the blood   an unusual or allergic reaction to trazodone, other medicines, foods, dyes or preservatives   pregnant or trying to get pregnant   breast-feeding  How should I use this medicine?  Take this medicine by mouth with a glass of water. Follow the directions on the prescription label. Take this medicine shortly after a meal or a light snack. Take your medicine at regular intervals. Do not take your medicine more often than directed. Do not stop taking except on your doctor's advice.  A special MedGuide will be given to you by the pharmacist with each prescription and refill. Be sure to read this information carefully each time.  Talk to your pediatrician regarding the use of this medicine in children. Special care may be needed.  Overdosage: If you think you have taken too much of this medicine contact a poison control center or emergency room at once.  NOTE: This medicine is only for you. Do not share this medicine with others.  What if I miss a dose?  If you miss a dose, take it as soon as you can. If it is almost time for your next dose, take only that dose. Do not take double or extra doses.  What may interact with this medicine?  Do not take this medicine with any of the following medications:   linezolid   MAOIs like  Carbex, Eldepryl, Marplan, Nardil, and Parnate   methylene blue   nefazodone  This medicine may also interact with the following medications:   barbiturates such as phenobarbital   certain antidepressants or tranquilizers   digoxin   herbal medicines that contain kava kava, St John's wort, or valerian   ketoconazole   medicines for HIV or AIDS   medicines for seizures   other medicines for depression   warfarin  This list may not describe all possible interactions. Give your health care provider a list of all the medicines, herbs, non-prescription drugs, or dietary supplements you use. Also tell them if you smoke, drink alcohol, or use illegal drugs. Some items may interact with your medicine.  What should I watch for while using this medicine?  Visit your doctor or health care professional for regular checks on your progress. You may have to take this medicine for two weeks or more before you feel better.  Patients and their families should watch out for worsening depression or thoughts of suicide. Also watch out for sudden or severe changes in feelings such as feeling anxious, agitated, panicky, irritable, hostile, aggressive, impulsive, severely restless, overly excited and hyperactive, or not being able to sleep. If this happens, especially at the beginning of antidepressant treatment or after a change in dose, call your health care professional.  You may get drowsy,  dizzy or have blurred vision. Do not drive, use machinery, or do anything that needs mental alertness until you know how this medicine affects you. Do not stand or sit up quickly, especially if you are an older patient. This reduces the risk of dizzy or fainting spells. Alcohol may increase dizziness or drowsiness. Avoid alcoholic drinks.  This medicine can make your mouth dry. Chewing sugarless gum, sucking hard candy and drinking plenty of water may help. Contact your doctor if the problem does not go away or is severe.  Do not treat  yourself for coughs, colds, or allergies without asking your doctor or health care professional for advice. Some ingredients may increase possible side effects.  If you are going to have surgery, tell your doctor or health care professional that you are taking this drug.  What side effects may I notice from receiving this medicine?  Side effects that you should report to your doctor or health care professional as soon as possible:   allergic reactions like skin rash, itching or hives, swelling of the face, lips, or tongue   fast, irregular heartbeat   feeling faint or lightheaded, falls   painful erections or other sexual dysfunction   suicidal thoughts or other mood changes   trembling  Side effects that usually do not require medical attention (report to your doctor or health care professional if they continue or are bothersome):   constipation   headache   muscle aches or pains   nausea, vomiting   unusually weak or tired  This list may not describe all possible side effects. Call your doctor for medical advice about side effects. You may report side effects to FDA at 1-800-FDA-1088.  Where should I keep my medicine?  Keep out of the reach of children.  Store at room temperature between 15 and 30 degrees C (59 to 86 degrees F). Protect from light. Keep container tightly closed. Throw away any unused medicine after the expiration date.  NOTE:This sheet is a summary. It may not cover all possible information. If you have questions about this medicine, talk to your doctor, pharmacist, or health care provider. Copyright 2011 [employer] Standard

## 2012-06-08 ENCOUNTER — Ambulatory Visit (INDEPENDENT_AMBULATORY_CARE_PROVIDER_SITE_OTHER): Payer: BLUE CROSS/BLUE SHIELD | Admitting: Internal Medicine

## 2012-06-14 ENCOUNTER — Other Ambulatory Visit (INDEPENDENT_AMBULATORY_CARE_PROVIDER_SITE_OTHER): Payer: Self-pay | Admitting: Internal Medicine

## 2012-06-14 DIAGNOSIS — G479 Sleep disorder, unspecified: Secondary | ICD-10-CM

## 2012-06-18 MED ORDER — TRAZODONE HCL 50 MG PO TABS
50.00 mg | ORAL_TABLET | Freq: Every evening | ORAL | Status: AC
Start: 2012-06-14 — End: 2012-06-29

## 2012-06-18 NOTE — Telephone Encounter (Signed)
Prescription for trazodone 50 mg at night #30 tablets with 2 refills was sent to patient's preferred pharmacy

## 2012-06-20 ENCOUNTER — Encounter (INDEPENDENT_AMBULATORY_CARE_PROVIDER_SITE_OTHER): Payer: Self-pay

## 2012-07-03 ENCOUNTER — Encounter (INDEPENDENT_AMBULATORY_CARE_PROVIDER_SITE_OTHER): Payer: Self-pay | Admitting: Internal Medicine

## 2012-08-03 ENCOUNTER — Ambulatory Visit (INDEPENDENT_AMBULATORY_CARE_PROVIDER_SITE_OTHER): Payer: BLUE CROSS/BLUE SHIELD | Admitting: Internal Medicine

## 2012-08-03 ENCOUNTER — Encounter (INDEPENDENT_AMBULATORY_CARE_PROVIDER_SITE_OTHER): Payer: Self-pay | Admitting: Internal Medicine

## 2012-08-03 VITALS — BP 119/71 | HR 71 | Temp 98.4°F | Ht 63.5 in | Wt 167.0 lb

## 2012-08-03 DIAGNOSIS — L739 Follicular disorder, unspecified: Secondary | ICD-10-CM

## 2012-08-03 DIAGNOSIS — L678 Other hair color and hair shaft abnormalities: Secondary | ICD-10-CM

## 2012-08-03 MED ORDER — CEPHALEXIN 500 MG PO CAPS
500.00 mg | ORAL_CAPSULE | Freq: Four times a day (QID) | ORAL | Status: AC
Start: 2012-08-03 — End: 2012-08-13

## 2012-08-03 NOTE — Progress Notes (Signed)
Subjective:       Patient ID: Carl Gibbs is a 56 y.o. male.    HPI Comments: Patient presents with:    Insect Bite - tx w/ steroids, no relief x 3 weeks      He now returns to clinic with complaints of having multiple insect bite over both lower extremities and now has occasional lesion on the upper back and one on the which happened about 3 weeks ago when he was visiting one of his friends he was mowing the lawn and he spent a few minutes with him on the grass during which time he noticed some insect bite but did not have any symptoms at that point but Later on complaint of having itching.  He tried over-the-counter medication without much relief and tried cortisone cream.   There is no complaint of having any fever or chills.  Sore throat, productive cough, chest tightness, wheezing, nausea vomiting.      The following portions of the patient's history were reviewed and updated as appropriate:   Past Medical History   Diagnosis Date   . Internal hemorrhoids 2008   . Diverticulosis 2008     by colonoscopy   . Allergy      ragweed,leaf,mold,dust,seasonal,beestings   . Diabetes mellitus 2006     type 2   . Cancer 2001-2002     prostate   . Hypertension    . Hyperlipidemia 1990   . Migraine syndrome    . Hypothyroidism    . Insomnia    . OSA (obstructive sleep apnea)    . Incomplete RBBB 2007   . Hiatal hernia 08/2011   . Erosive esophagitis 09/2011   . Treadmill stress test negative for angina pectoris 10/2011       Past Surgical History   Procedure Date   . Hernia repair 1970's   . Prostatectomy 2001   . Nasal septum surgery 1990   . Joint replacement 2007     knee replacements,both   . Esophagogastroduodenoscopy 09/02/11     hiatal hernia`   . Bariatric surgery 11/16/11     Dr. Regan Lemming       History     Social History   . Marital Status: Married     Spouse Name: N/A     Number of Children: N/A   . Years of Education: N/A     Occupational History   . Not on file.     Social History Main Topics   . Smoking status:  Never Smoker    . Smokeless tobacco: Never Used   . Alcohol Use: Yes   . Drug Use: No   . Sexually Active:      Other Topics Concern   . Not on file     Social History Narrative   . No narrative on file       No family history on file.    No Known Allergies    Current Outpatient Prescriptions   Medication Status Sig Dispense Refill   . ALPROSTADIL, BULK, POWD Active        . fexofenadine (ALLEGRA) 30 MG tablet Active Take 30 mg by mouth daily.        Marland Kitchen glucose blood test strip Active Contour glucose strips  100 each  1   . Lancets Thin MISC Active by Does not apply route.         Marland Kitchen levothyroxine (SYNTHROID, LEVOTHROID) 100 MCG tablet Active Take 1 tablet (100 mcg total)  by mouth daily.  90 tablet  3   . naproxen (NAPROSYN) 375 MG tablet Active Take 1 tablet (375 mg total) by mouth daily as needed.  90 tablet  1   . omeprazole (PRILOSEC) 40 MG capsule Active        . OMNARIS 50 MCG/ACT nasal spray Active        . CHLORPHENIRAMINE-HYDROCODONE 8-10 MG/5ML PO LQCR Discontinued Take 5 mLs by mouth every 12 (twelve) hours as needed.  115 mL  0   . cephalexin (KEFLEX) 500 MG capsule Active Take 1 capsule (500 mg total) by mouth 4 (four) times daily.  40 capsule  0   . propranolol (INDERAL) 10 MG tablet Active Take 4 tablets (40 mg total) by mouth as needed (for performance anxiety).  90 tablet  2   . SF 1.1 % GEL Active        . ondansetron (ZOFRAN-ODT) 4 MG disintegrating tablet Discontinued        . PATANASE 0.6 % SOLN Discontinued        . zolpidem (AMBIEN) 10 MG tablet Discontinued Take 1 tablet (10 mg total) by mouth nightly as needed.  90 tablet  0       BP 119/71  Pulse 71  Temp(Src) 98.4 F (36.9 C) (Oral)  Ht 1.613 m (5' 3.5")  Wt 75.751 kg (167 lb)  BMI 29.12 kg/m2  .    Review of Systems   Constitutional: Negative.    HENT: Negative.    Eyes: Negative.    Respiratory: Negative.    Cardiovascular: Negative.    Gastrointestinal: Negative.    Genitourinary: Negative.    Musculoskeletal: Negative.    Skin:         As per H&P   Neurological: Negative.            Objective:    Physical Exam   Constitutional: He appears well-developed. No distress.        Small framed, mildly overweight   HENT:   Head: Normocephalic and atraumatic.   Skin: Skin is warm and dry. Rash noted.        Maculopapular, occasional pustular scattered lesions over both lower extremities anteromedially and occasional lesion on upper back and one on her trunk but no vesicles or blisters or lichenification noted.              Assessment:     1    ? Insect bites vs  Folliculitis    2     Previous history of diabetes    3     DJD, First bilateral TKR      Plan:     We had a long discussion with patient regarding skin care and advise patient not to scratch and also apply Neosporin ointment topically twice a day until healed.   We will treat with Keflex 500 mg 4 times a day for 10 days.  He will followup in one week, sooner if his symptoms do get worse or go to emergency room.   We will continue other medications.

## 2012-08-03 NOTE — Patient Instructions (Signed)
Insect Bites and Stings  Most insect bites are harmless and cause only minor swelling or itching. But if you're allergic to insects such as wasps or bees, a sting can cause a life-threatening allergic reaction. The venom (poison) from scorpions and certain spiders can also be deadly, although this is rare. Knowing when to seek emergency care could save your life.     The black widow (top) and brown recluse (bottom) are two poisonous spiders found in the [place].   When to Go to the Emergency Room (ER)  Call 911 immediately for any:   Scorpion sting.   Bite from a black, red, or brown widow spider or brown recluse spider.   Signs of an allergic reaction. These include: hives; swelling of your eyes, lips, or the inside of your throat; trouble breathing; and dizziness or confusion.  What to Expect in the ER   If you're having trouble breathing, you'll be given oxygen through a mask. In case of severe breathing difficulty, you may have a tube inserted in your throat and be placed on a ventilator (breathing machine).   If you are having a severe allergic reaction from a sting (called anaphylaxis), you may be given a shot of epinephrine. If it is known that you are allergic to bee or wasp stings, your doctor may give you a prescription for an "epi-pen" that you can keep with you at all times in case of a sting.   You may receive antivenin (a substance that reverses the effects of poison) for some spider bites and scorpion stings. Because antivenin can sometimes cause other problems, your doctor will weigh the risks and benefits of this treatment.   Steroids such as prednisone are often used to treat allergic reactions. In many cases, your doctor will prescribe an antihistamine to help relieve itching.  Easing symptoms of an insect bite   Try to remove a stinger you can see. Use your fingernail, a knife edge, or credit card to scrape against the skin. Do not squeeze or pull.   Apply ice or a cold compress  to reduce pain and swelling (keep a thin cloth between the cold source and the skin).      [address], [place], [place] [address]. All rights reserved. This information is not intended as a substitute for professional medical care. Always follow your healthcare professional's instructions.

## 2012-08-03 NOTE — Progress Notes (Signed)
1. Have you self referred yourself since we last saw you? Yes, walk-in clinic.

## 2012-08-07 ENCOUNTER — Other Ambulatory Visit (INDEPENDENT_AMBULATORY_CARE_PROVIDER_SITE_OTHER): Payer: Self-pay

## 2012-08-07 DIAGNOSIS — M549 Dorsalgia, unspecified: Secondary | ICD-10-CM

## 2012-08-08 MED ORDER — NAPROXEN 375 MG PO TABS
375.00 mg | ORAL_TABLET | Freq: Every day | ORAL | Status: DC | PRN
Start: 2012-08-07 — End: 2012-10-31

## 2012-08-08 NOTE — Telephone Encounter (Signed)
Prescription for naproxen was sent to patient's preferred pharmacy.

## 2012-08-17 ENCOUNTER — Encounter (INDEPENDENT_AMBULATORY_CARE_PROVIDER_SITE_OTHER): Payer: Self-pay

## 2012-08-17 ENCOUNTER — Ambulatory Visit (INDEPENDENT_AMBULATORY_CARE_PROVIDER_SITE_OTHER): Payer: BLUE CROSS/BLUE SHIELD | Admitting: Internal Medicine

## 2012-08-17 VITALS — BP 107/73 | HR 69 | Temp 98.3°F | Ht 63.0 in | Wt 169.0 lb

## 2012-08-17 DIAGNOSIS — E039 Hypothyroidism, unspecified: Secondary | ICD-10-CM

## 2012-08-17 DIAGNOSIS — L259 Unspecified contact dermatitis, unspecified cause: Secondary | ICD-10-CM

## 2012-08-17 MED ORDER — HYDROXYZINE HCL 10 MG PO TABS
10.00 mg | ORAL_TABLET | Freq: Three times a day (TID) | ORAL | Status: AC | PRN
Start: 2012-08-17 — End: 2012-08-27

## 2012-08-17 NOTE — Progress Notes (Signed)
Subjective:       Patient ID: Carl Gibbs is a 56 y.o. male.    HPI Comments: Patient presents with:    Follow-up - insect bite      He now returns to clinic for insect bite followup, generally he is doing better and most of the lesions on the lower back, abdominal wall, forearm and left leg has healed  Except one on the right leg medially and near ankle laterally has not healed and complains of having itching and scratching it.   He did try over-the-counter Benadryl last night with some relief.  There is no complaint of having any calf tenderness, pleuritic pain, fever or chills, any similar lesions anywhere else.   He has been applying Neosporin ointment topically, most of the lesions are significantly reduced in size      The following portions of the patient's history were reviewed and updated as appropriate: allergies, current medications, past family history, past medical history, past social history, past surgical history and problem list.    Review of Systems   Constitutional: Negative.    HENT: Negative.    Respiratory: Negative.    Cardiovascular: Negative.    Gastrointestinal: Negative.    Musculoskeletal: Negative.    Skin:        As per H&P           Objective:    Physical Exam   Constitutional: He appears well-developed and well-nourished. No distress.   Neck: Normal range of motion. Neck supple. No JVD present. No thyromegaly present.   Abdominal: Soft. Bowel sounds are normal.        Moderately obese no guarding or rigidity noted is as noted on the wall   Lymphadenopathy:     He has no cervical adenopathy.   Skin:        Maculopapular erythematous scattered few lesions near the right ankle laterally and also one on the calf medially but no signs of cellulitis, vesicles or blisters or lichenification.  His lesions are smaller than last visit           Assessment:       1. Dermatitis, contact     2. Hypothyroid  TSH   3   Obesity, moderate        Plan:     As above we had a long discussion with  patient and clinically he has improved significantly and encouraged patient to avoid scratching and we have given Atarax 10 mg 3 times a day when necessary and have discussed side effect of medication including drowsiness.   He will continue to apply Neosporin topical ear we have offered steroid cream but he has elected not to use it.   Since he did not have a TSH done lately we have encouraged patient to have TSH done and then adjust the medication to keep TSH between 1 and 3.     He will continue to follow diet and continue to try to lose weight and increase exercise.     If clinically better than he will followup in 8-10 weeks otherwise 1-2 weeks

## 2012-08-18 LAB — TSH: TSH: 1.15 mIU/L (ref 0.40–4.50)

## 2012-09-17 ENCOUNTER — Other Ambulatory Visit (INDEPENDENT_AMBULATORY_CARE_PROVIDER_SITE_OTHER): Payer: Self-pay

## 2012-09-18 MED ORDER — TRAZODONE HCL 50 MG PO TABS
50.00 mg | ORAL_TABLET | Freq: Every day | ORAL | Status: DC
Start: 2012-09-17 — End: 2012-11-26

## 2012-09-18 NOTE — Telephone Encounter (Signed)
Prescription for trazodone was sent to patient's preferred pharmacy.

## 2012-10-29 ENCOUNTER — Other Ambulatory Visit (INDEPENDENT_AMBULATORY_CARE_PROVIDER_SITE_OTHER): Payer: Self-pay | Admitting: Internal Medicine

## 2012-10-31 ENCOUNTER — Other Ambulatory Visit (INDEPENDENT_AMBULATORY_CARE_PROVIDER_SITE_OTHER): Payer: Self-pay | Admitting: Internal Medicine

## 2012-10-31 MED ORDER — NAPROXEN 375 MG PO TABS
375.00 mg | ORAL_TABLET | Freq: Every day | ORAL | Status: DC | PRN
Start: 2012-10-31 — End: 2012-11-29

## 2012-10-31 NOTE — Telephone Encounter (Signed)
30-day supply for naproxen PRN was sent to patient's preferred pharmacy.  He has not had renal function tested since 04/2012 and will need to have this done during upcoming visit.

## 2012-11-13 ENCOUNTER — Encounter (INDEPENDENT_AMBULATORY_CARE_PROVIDER_SITE_OTHER): Payer: Self-pay | Admitting: Internal Medicine

## 2012-11-13 NOTE — Progress Notes (Signed)
Lab results faxed to [place] ordered by Dr. Nelda Severe and done on October 31, 2012 were reviewed:  WBC 6.3, hemoglobin 14.8, hematocrit 45, platelets 270    Hemoglobin A1c 5.3%    Sodium 142, potassium 4.4, chloride 104, carbon dioxide 28, glucose 102, BUN 18, creatinine 0.68, total protein 6.4, albumin 4.0, ALTs 12, AST 16, total bilirubin 0.4    Cholesterol 189, triglycerides 86, LDL 134, HDL 38    Ferritin 20, iron 125, transferrin 234    Vitamin B12 754  D greater than 20    PTH 17  Vitamin D-25 OH 27.6

## 2012-11-14 ENCOUNTER — Encounter (INDEPENDENT_AMBULATORY_CARE_PROVIDER_SITE_OTHER): Payer: Self-pay

## 2012-11-26 ENCOUNTER — Other Ambulatory Visit (INDEPENDENT_AMBULATORY_CARE_PROVIDER_SITE_OTHER): Payer: Self-pay

## 2012-11-26 ENCOUNTER — Ambulatory Visit (INDEPENDENT_AMBULATORY_CARE_PROVIDER_SITE_OTHER): Payer: BLUE CROSS/BLUE SHIELD | Admitting: Internal Medicine

## 2012-11-26 ENCOUNTER — Encounter (INDEPENDENT_AMBULATORY_CARE_PROVIDER_SITE_OTHER): Payer: Self-pay | Admitting: Internal Medicine

## 2012-11-26 VITALS — BP 130/76 | HR 60 | Temp 98.1°F | Ht 63.0 in | Wt 171.0 lb

## 2012-11-26 MED ORDER — HYDROCODONE-ACETAMINOPHEN 2.5-500 MG PO TABS
1.00 | ORAL_TABLET | Freq: Every day | ORAL | Status: DC | PRN
Start: 2012-11-26 — End: 2013-05-15

## 2012-11-26 MED ORDER — ATORVASTATIN CALCIUM 10 MG PO TABS
10.00 mg | ORAL_TABLET | Freq: Every day | ORAL | Status: DC
Start: 2012-11-26 — End: 2013-02-21

## 2012-11-26 MED ORDER — TRAZODONE HCL 50 MG PO TABS
50.00 mg | ORAL_TABLET | Freq: Every day | ORAL | Status: DC
Start: 2012-11-26 — End: 2013-03-18

## 2012-11-26 MED ORDER — LEVOTHYROXINE SODIUM 100 MCG PO TABS
100.00 ug | ORAL_TABLET | Freq: Every day | ORAL | Status: DC
Start: 2012-11-26 — End: 2013-05-22

## 2012-11-26 MED ORDER — CELECOXIB 200 MG PO CAPS
200.00 mg | ORAL_CAPSULE | Freq: Every day | ORAL | Status: DC
Start: 2012-11-26 — End: 2013-01-28

## 2012-11-26 MED ORDER — PROPRANOLOL HCL 40 MG PO TABS
80.00 mg | ORAL_TABLET | ORAL | Status: DC | PRN
Start: 2012-11-26 — End: 2013-02-07

## 2012-11-26 NOTE — Progress Notes (Signed)
Subjective:       Patient ID: Carl Gibbs is a 56 y.o. male.  Patient seen in clinic on 11/26/2012    Chief Complaint   Patient presents with   . Hyperlipidemia     follow up, discuss labs   . Medication Refill   . Back Pain       HPI  Patient is to take for followup of hyperlipidemia, hypothyroidism, performance anxiety, and also for evaluation of back pain.  Patient recently had labs done with Dr. Vernice Jefferson (bariatric surgery), which were faxed to our office. TSH was not done    Patient states that approximately 4-5 weeks ago he was doing some back stretches for working out and feels like he may have hurt his back at that time, as he has since been having mid and low back pain intermittently.  He denies any problems with sharp shooting pains or numbness in extremities, extremity weakness, fevers, chills, night sweats.    H/o hyperlipidemia - Last lipid panel done 10/2012 showed LDL 134 as below.  Patient would like to start statin to reduce his cardiac risk.  Exercising regularly.  Watching his diet but admits he eats ice cream and some fried foods intermittently.      Hypothyroidism - Last TSH was done 08/2012: 1.15.  Patient reports compliance with levothyroxine 100 g daily.  He denies any problems with increasing fatigue, hair thinning, constipation, excessively dry skin, lower extremity edema, palpitations, diaphoresis, or tremors.    Performance anxiety - Takes propranolol prior to performances, which helps significantly.  Wants refill.     Gets "strong headaches" occasionally that are not associated with visual changes, nausea, vomiting, photophobia, or phonophobia.  States when headaches happen NSAIDs or Tylenol do not help.  Has been given a rx for Vicodin in the past to take rarely as needed for headaches and would like to have ne rx written.     Sleeping better on trazodone, but feels sometimes it is not as effective.  Takes 50mg  at night of trazodone.     Takes naproxen 375 several days a week for  chronic back pain.      Wants to have influenza vaccine today.      The following portions of the patient's history were reviewed and updated as appropriate: allergies, current medications, past medical history, past social history and problem list.    Past Medical History   Diagnosis Date   . Internal hemorrhoids 2008   . Diverticulosis 2008     by colonoscopy   . Allergy      ragweed,leaf,mold,dust,seasonal,beestings   . Diabetes mellitus 2006     type 2   . Cancer 2001-2002     prostate   . Hypertension    . Hyperlipidemia 1990   . Migraine syndrome    . Hypothyroidism    . Insomnia    . OSA (obstructive sleep apnea)    . Incomplete RBBB 2007   . Hiatal hernia 08/2011   . Erosive esophagitis 09/2011   . Treadmill stress test negative for angina pectoris 10/2011       History     Social History   . Marital Status: Married     Spouse Name: N/A     Number of Children: N/A   . Years of Education: N/A     Occupational History   . Not on file.     Social History Main Topics   . Smoking status: Never Smoker    .  Smokeless tobacco: Never Used   . Alcohol Use: Yes   . Drug Use: No   . Sexually Active:      Other Topics Concern   . Not on file     Social History Narrative   . No narrative on file       No Known Allergies    Current Outpatient Prescriptions   Medication Sig Dispense Refill   . glucose blood test strip Contour glucose strips  100 each  1   . Lancets Thin MISC by Does not apply route.         Marland Kitchen levothyroxine (SYNTHROID, LEVOTHROID) 100 MCG tablet Take 1 tablet (100 mcg total) by mouth daily.  90 tablet  3   . naproxen (NAPROSYN) 375 MG tablet Take 1 tablet (375 mg total) by mouth daily as needed.  30 tablet  0   . omeprazole (PRILOSEC) 40 MG capsule Take 40 mg by mouth daily.        . propranolol (INDERAL) 10 MG tablet Take 4 tablets (40 mg total) by mouth as needed (for performance anxiety).  90 tablet  2   . SF 1.1 % GEL        . traZODONE (DESYREL) 50 MG tablet Take 1 tablet (50 mg total) by mouth daily.   30 tablet  2   . ALPROSTADIL, BULK, POWD        . DYMISTA 137-50 MCG/ACT SUSP        . fexofenadine (ALLEGRA) 30 MG tablet Take 30 mg by mouth daily.        Marland Kitchen OMNARIS 50 MCG/ACT nasal spray        . PATANASE 0.6 % SOLN        . Phentolamine Mesylate POWD          Review of Systems   Constitutional: Negative for fever, chills, fatigue and unexpected weight change.   HENT: Negative for hearing loss.    Eyes: Negative for visual disturbance.   Respiratory: Negative for cough, chest tightness and shortness of breath.    Cardiovascular: Negative for chest pain, palpitations and leg swelling.   Gastrointestinal: Negative for nausea, vomiting, diarrhea and constipation.   Genitourinary: Negative for difficulty urinating.   Musculoskeletal: Positive for back pain.   Skin: Negative for rash.   Neurological: Negative for dizziness, weakness, light-headedness and headaches.   Hematological: Does not bruise/bleed easily.   Psychiatric/Behavioral: Negative for dysphoric mood.         Objective:    Physical Exam   Vitals reviewed.  Constitutional: He is oriented to person, place, and time.        Obese     HENT:        Moist mucous membranes   Eyes: Conjunctivae normal are normal. Right eye exhibits no discharge. Left eye exhibits no discharge. No scleral icterus.   Neck: Neck supple.   Cardiovascular: Normal rate and regular rhythm.  Exam reveals no gallop and no friction rub.    No murmur heard.  Pulmonary/Chest: Effort normal and breath sounds normal. No respiratory distress. He has no wheezes.   Abdominal: Soft. There is no tenderness. There is no rebound and no guarding.   Musculoskeletal:        Arms:  Neurological: He is alert and oriented to person, place, and time.   Skin: Skin is warm and dry.   Psychiatric: His behavior is normal.     BP 130/76  Pulse 60  Temp 98.1 F (36.7  C)  Ht 1.6 m (5\' 3" )  Wt 77.565 kg (171 lb)  BMI 30.30 kg/m2    Labs done 11/30/2012 reviewed:  WBC 6.3, hemoglobin 14.8, hematocrit 45,  platelets 270     Hemoglobin A1c 5.3%     Sodium 142, potassium 4.4, chloride 104, carbon dioxide 28, glucose 102, BUN 18, creatinine 0.68, total protein 6.4, albumin 4.0, ALTs 12, AST 16, total bilirubin 0.4     Cholesterol 189, triglycerides 86, LDL 134, HDL 38     Ferritin 20, iron 125, transferrin 234     Vitamin B12 754     PTH 17   Vitamin D-25 OH 27.6        Assessment:       1. Hypothyroidism  TSH, levothyroxine (SYNTHROID, LEVOTHROID) 100 MCG tablet   2. Hyperlipidemia LDL goal < 100  Comprehensive metabolic panel, atorvastatin (LIPITOR) 10 MG tablet, Lipid panel   3. Headache  HYDROcodone-acetaminophen (VICODIN) 2.5-500 MG per tablet   4. Performance anxiety  propranolol (INDERAL) 40 MG tablet   5. Difficulty sleeping  traZODone (DESYREL) 50 MG tablet   6. Back pain  celecoxib (CELEBREX) 200 MG capsule   7. Need for influenza vaccination  Flu vaccine 18-64 intradermalee IM         Plan:       1.  Mid-back pain - Likely muscular in origin.  No neurologic symptoms/signs.  Discussed with patient concerns about chronic naproxen use (increased risk for GI bleeding and renal insufficiency).  Given chronic back pain, will see if insurance covers Celebrex, which patient may take daily if needed.  Advised to do regular stretches prior to exercising and after.     2.  Hypothyroidism - Well Until the last check.  We will check a TSH and, in the interim, we will continue current levothyroxine 100 g daily.    3.  Hyperlipidemia - we have discussed with patient importance of following a low-fat diet and continue with regular exercise.  We will begin therapy with atorvastatin 10 mg daily.    4.  We will continue current propanolol as needed for performance anxiety, which is working well.    5.  Patient may increase trazodone from 50 mg and then to 75 mg at night for difficulty sleeping, as the medication has been helping.    6.  Influenza vaccine adminstered today in clinic    7. We have spent at least 50-% of this 25  minute office visit counseling the patient on one or more  of the following:    Disease state-   Discussion about the medical condition, diagnostic and treatment         options  Medications-   Indications and side effects  Lifestyle issues such as diet, exercise, tobacco and alcohol use, sleep, stress, depression and anxiety    8.  Patient will RTC in 2 months for f/u CMP and lipid panel for hyperlipidemia, sooner as needed.

## 2012-11-26 NOTE — Progress Notes (Signed)
Has the patient sought any care outside of the [facility]? Refer to care team

## 2012-11-29 ENCOUNTER — Other Ambulatory Visit (INDEPENDENT_AMBULATORY_CARE_PROVIDER_SITE_OTHER): Payer: Self-pay | Admitting: Internal Medicine

## 2012-11-29 MED ORDER — NAPROXEN 375 MG PO TABS
375.00 mg | ORAL_TABLET | Freq: Every day | ORAL | Status: DC | PRN
Start: 2012-11-29 — End: 2012-12-25

## 2012-11-29 NOTE — Telephone Encounter (Signed)
Rx for naproxen was sent to patient's preferred pharmacy.

## 2012-12-10 ENCOUNTER — Ambulatory Visit (INDEPENDENT_AMBULATORY_CARE_PROVIDER_SITE_OTHER): Payer: BLUE CROSS/BLUE SHIELD | Admitting: Internal Medicine

## 2012-12-14 ENCOUNTER — Other Ambulatory Visit (INDEPENDENT_AMBULATORY_CARE_PROVIDER_SITE_OTHER): Payer: Self-pay

## 2012-12-18 NOTE — Telephone Encounter (Signed)
Spoke to patient states he is not seeing Dr Venda Rodes request that his medication refill request get sent to Dr Yancey Flemings at [facility].

## 2012-12-19 ENCOUNTER — Other Ambulatory Visit (INDEPENDENT_AMBULATORY_CARE_PROVIDER_SITE_OTHER): Payer: Self-pay | Admitting: Internal Medicine

## 2012-12-19 NOTE — Telephone Encounter (Signed)
Rx for levothyroxine was sent to patient's preferred pharmacy 11/26/2012.  Please notify patient.

## 2012-12-21 ENCOUNTER — Other Ambulatory Visit (INDEPENDENT_AMBULATORY_CARE_PROVIDER_SITE_OTHER): Payer: Self-pay | Admitting: Internal Medicine

## 2012-12-21 NOTE — Telephone Encounter (Signed)
Patient called in this morning requesting to speak w/ a nurse. He wanted to inform them and Dr. Yancey Flemings regarding his meds. He did not take his omeprazole (PRILOSEC) 40 MG for a 2 days just to see how he would feel w/o it and states that he WILL continue taking this medication, acid reflux came back. He would like for a refill on the Prilosec to be called in for him.  Also the celecoxib (CELEBREX) 200 MG was no help and he went back on naproxen (NAPROSYN) 375 MG. If there is another medication that would help him, he would be open to the idea.      Please give patient a call at [phone] or [phone]. Please try both #'s. If he does not answer on his cell, please leave him a message.      Thank you!

## 2012-12-25 MED ORDER — NAPROXEN 375 MG PO TABS
375.0000 mg | ORAL_TABLET | Freq: Every day | ORAL | Status: AC | PRN
Start: 2012-12-25 — End: 2013-12-25

## 2012-12-25 MED ORDER — OMEPRAZOLE 40 MG PO CPDR
40.00 mg | DELAYED_RELEASE_CAPSULE | Freq: Every day | ORAL | Status: DC
Start: 2012-12-25 — End: 2013-03-22

## 2012-12-25 NOTE — Telephone Encounter (Signed)
Rx for Prilosec and naproxen was sent to patient's preferred pharmacy.

## 2012-12-25 NOTE — Telephone Encounter (Signed)
Pt. Notified.

## 2012-12-25 NOTE — Addendum Note (Signed)
Addended by: Caesar Chestnut on: 12/25/2012 11:53 AM     Modules accepted: Orders

## 2013-01-28 ENCOUNTER — Ambulatory Visit (INDEPENDENT_AMBULATORY_CARE_PROVIDER_SITE_OTHER): Payer: BLUE CROSS/BLUE SHIELD | Admitting: Internal Medicine

## 2013-01-28 ENCOUNTER — Encounter (INDEPENDENT_AMBULATORY_CARE_PROVIDER_SITE_OTHER): Payer: Self-pay | Admitting: Internal Medicine

## 2013-01-28 VITALS — BP 126/75 | HR 60 | Temp 97.7°F | Ht 63.0 in | Wt 168.2 lb

## 2013-01-28 MED ORDER — AMOXICILLIN-POT CLAVULANATE 875-125 MG PO TABS
1.00 | ORAL_TABLET | Freq: Two times a day (BID) | ORAL | Status: AC
Start: 2013-01-28 — End: 2013-02-07

## 2013-01-28 NOTE — Progress Notes (Signed)
Subjective:       Patient ID: Carl Gibbs is a 57 y.o. male.  Chief Complaint   Patient presents with   . Nasal Congestion       HPI  About 11-12 days ago started having headache, scratchy throat, runny nose, sneezing. Some dry cough.  Denies any fevers, chills, short of breath, nausea, vomiting, diarrhea, ear pain.  No sick contacts.    Feels nasal/sinus congestion.  Has been using Neti pot, Benadryl.  Has been feeling very tired as well as usually happens with his sinus infections.  Uses Dymista intermittently.  Patient is requesting to have oral steroids and antibiotics, which he states is what he usually gets for these symptoms.      The following portions of the patient's history were reviewed and updated as appropriate: allergies, current medications, past medical history, past social history and problem list.    Past Medical History   Diagnosis Date   . Internal hemorrhoids 2008   . Diverticulosis 2008     by colonoscopy   . Allergy      ragweed,leaf,mold,dust,seasonal,beestings   . Diabetes mellitus 2006     type 2   . Cancer 2001-2002     prostate   . Hypertension    . Hyperlipidemia 1990   . Migraine syndrome    . Hypothyroidism    . Insomnia    . OSA (obstructive sleep apnea)    . Incomplete RBBB 2007   . Hiatal hernia 08/2011   . Erosive esophagitis 09/2011   . Treadmill stress test negative for angina pectoris 10/2011       History     Social History   . Marital Status: Married     Spouse Name: N/A     Number of Children: N/A   . Years of Education: N/A     Occupational History   . Not on file.     Social History Main Topics   . Smoking status: Never Smoker    . Smokeless tobacco: Never Used   . Alcohol Use: Yes   . Drug Use: No   . Sexually Active:      Other Topics Concern   . Not on file     Social History Narrative   . No narrative on file       No Known Allergies    Current Outpatient Prescriptions   Medication Sig Dispense Refill   . atorvastatin (LIPITOR) 10 MG tablet Take 1 tablet (10 mg total)  by mouth daily.  30 tablet  2   . B-D INS SYR ULTRAFINE 1CC/30G 30G X 1/2" 1 ML MISC        . cyanocobalamin (VITAMIN B12) 1000 MCG/ML injection        . DYMISTA 137-50 MCG/ACT SUSP        . glucose blood test strip Contour glucose strips  100 each  1   . HYDROcodone-acetaminophen (VICODIN) 2.5-500 MG per tablet Take 1 tablet by mouth daily as needed for Pain.  10 tablet  0   . Lancets Thin MISC by Does not apply route.         Marland Kitchen levothyroxine (SYNTHROID, LEVOTHROID) 100 MCG tablet Take 1 tablet (100 mcg total) by mouth daily.  90 tablet  1   . naproxen (NAPROSYN) 375 MG tablet Take 1 tablet (375 mg total) by mouth daily as needed.  30 tablet  2   . omeprazole (PRILOSEC) 40 MG capsule Take 1 capsule (40  mg total) by mouth daily.  30 capsule  2   . OMNARIS 50 MCG/ACT nasal spray        . propranolol (INDERAL) 40 MG tablet Take 2 tablets (80 mg total) by mouth as needed (for performance anxiety).  50 tablet  2   . SF 1.1 % GEL        . traZODone (DESYREL) 50 MG tablet Take 1 tablet (50 mg total) by mouth daily.  30 tablet  2   . fexofenadine (ALLEGRA) 30 MG tablet Take 30 mg by mouth daily.        . [DISCONTINUED] amoxicillin (AMOXIL) 500 MG capsule        . [DISCONTINUED] celecoxib (CELEBREX) 200 MG capsule Take 1 capsule (200 mg total) by mouth daily.  30 capsule  2   . [DISCONTINUED] ibuprofen (ADVIL,MOTRIN) 800 MG tablet        . [DISCONTINUED] PATANASE 0.6 % SOLN        . [DISCONTINUED] Phentolamine Mesylate POWD            Review of Systems  Per HPI      Objective:    Physical Exam   Vitals reviewed.  Constitutional: He is oriented to person, place, and time. He appears well-developed. No distress.        overweight   HENT:   Right Ear: Tympanic membrane, external ear and ear canal normal.   Left Ear: Tympanic membrane, external ear and ear canal normal.   Nose: Mucosal edema present. No rhinorrhea. Right sinus exhibits no maxillary sinus tenderness and no frontal sinus tenderness. Left sinus exhibits no  maxillary sinus tenderness and no frontal sinus tenderness.        Moist mucous membranes   Eyes: Conjunctivae normal are normal. Right eye exhibits no discharge. Left eye exhibits no discharge. No scleral icterus.   Cardiovascular: Normal rate and regular rhythm.  Exam reveals no gallop and no friction rub.    No murmur heard.  Pulmonary/Chest: Effort normal and breath sounds normal. No respiratory distress. He has no wheezes. He has no rales.   Neurological: He is alert and oriented to person, place, and time.   Skin: Skin is warm and dry. He is not diaphoretic.     BP 126/75  Pulse 60  Temp 97.7 F (36.5 C) (Oral)  Ht 1.6 m (5\' 3" )  Wt 76.318 kg (168 lb 4 oz)  BMI 29.81 kg/m2  SpO2 98%        Assessment:       1. Acute sinusitis  amoxicillin-clavulanate (AUGMENTIN) 875-125 MG per tablet                   Plan:       1.  We will treat patient with a 10-day course of Augmentin BID.  Advised using nasal saline spray 2 squirts in each nostril every 3-4 hours as needed.      2.  Discussed with patient that there is no need for steroids at this time.  Given his past h/o DM2 (resolved after gastric sleeve), would advise against steroid use unless absolutely necessary.     3.  Risk & Benefits of the new medication(s) were explained to the patient (and family) who verbalized understanding & agreed to the treatment plan. Patient (family) encouraged to contact me/clinical staff with any questions/concerns    4.  Patient to RTC if no improvement in 7 days, sooner as needed.

## 2013-01-28 NOTE — Patient Instructions (Signed)
Sinusitis    You have been seen for a sinus infection.    Sinus infections are common. They often happen after people have a virus or a common cold.    Symptoms are: Pain in the face, green or yellow mucous from the nose, fevers and chills. There may also be a feeling of fullness or pressure in the face.    Decongestants may help. This helps the sinuses drain. Sometimes a steroid spray is used.    You may need antibiotics for the infection. Not all cases of sinusitis need antibiotics. Viruses cause most sinusitis. Antibiotics do not work on viruses. Sometimes sinusitis and be caused by bacteria. These cases usually get better with medicine to help the sinuses drain. Antibiotics should be given only to patients who have symptoms for more than 2 weeks and if they have fevers, severe sinus pain and pus mixed in with the mucus.    YOU SHOULD SEEK MEDICAL ATTENTION IMMEDIATELY, EITHER HERE OR AT THE NEAREST EMERGENCY DEPARTMENT, IF ANY OF THE FOLLOWING OCCURS:   You do not get better with treatment.   You have severe headaches and high fevers or any confusion.   You have worse pain in the face. Your face gets more swollen.

## 2013-01-29 LAB — COMPREHENSIVE METABOLIC PANEL
ALT: 14 U/L (ref 9–46)
AST (SGOT): 14 U/L (ref 10–35)
Albumin/Globulin Ratio: 1.6 (ref 1.0–2.5)
Albumin: 4.4 G/DL (ref 3.6–5.1)
Alkaline Phosphatase: 69 U/L (ref 40–115)
BUN / Creatinine Ratio: 34.4 — ABNORMAL HIGH (ref 6–22)
BUN: 21 MG/DL (ref 7–25)
Bilirubin, Total: 0.4 MG/DL (ref 0.2–1.2)
CO2: 24 mmol/L (ref 19–30)
Calcium: 9.1 MG/DL (ref 8.6–10.3)
Chloride: 104 mmol/L (ref 98–110)
Creatinine: 0.61 mg/dL — ABNORMAL LOW (ref 0.70–1.33)
EGFR African American: 128 mL/min/{1.73_m2} (ref >=60)
EGFR: 110 mL/min/{1.73_m2} (ref >=60)
Globulin: 2.7 G/DL (ref 1.9–3.7)
Glucose: 103 MG/DL — ABNORMAL HIGH (ref 65–99)
Potassium: 3.9 mmol/L (ref 3.5–5.3)
Protein, Total: 7.1 G/DL (ref 6.1–8.1)
Sodium: 140 mmol/L (ref 135–146)

## 2013-01-29 LAB — LIPID PANEL
Cholesterol / HDL Ratio: 3.7 (ref 0.0–5.0)
Cholesterol: 155 MG/DL (ref 125–200)
HDL: 42 MG/DL (ref >=40)
LDL Calculated: 98 MG/DL (ref <130)
Non HDL Cholesterol (LDL and VLDL): 113 mg/dL
Triglycerides: 74 MG/DL (ref <150)

## 2013-01-29 LAB — TSH: TSH: 0.82 mIU/L (ref 0.40–4.50)

## 2013-01-30 ENCOUNTER — Telehealth (INDEPENDENT_AMBULATORY_CARE_PROVIDER_SITE_OTHER): Payer: Self-pay | Admitting: Internal Medicine

## 2013-01-30 ENCOUNTER — Other Ambulatory Visit (INDEPENDENT_AMBULATORY_CARE_PROVIDER_SITE_OTHER): Payer: Self-pay | Admitting: Internal Medicine

## 2013-01-30 MED ORDER — PREDNISONE 20 MG PO TABS
40.00 mg | ORAL_TABLET | Freq: Every day | ORAL | Status: AC
Start: 2013-01-30 — End: 2013-02-04

## 2013-01-30 NOTE — Telephone Encounter (Signed)
Discussed with patient during visit that we would not treat with steroids at that time, however if symptoms did not improve, would treat with brief course.  I have sent a prescription for prednisone 20mg  tablets, take 2 tablets daily for 5 days.  Patient to take the medication with food and monitor his blood sugars as he has a prior h/o diabetes (discussed during last clinic visit).  Please notify patient. Thanks.

## 2013-01-30 NOTE — Telephone Encounter (Signed)
Patient said that he was seen recently and he said that he feels the same he would like for Dr.Castillo to call him in some synthroid   Patient [phone]

## 2013-01-30 NOTE — Telephone Encounter (Signed)
Dr.Castillo, per pt. He is on his 3rd day of antibiotic. Still feels "crappy". He wants a steroid to be called in to his pharmacy. Per pt. The steroid seems to help.

## 2013-01-30 NOTE — Telephone Encounter (Signed)
Lm to notify pt.

## 2013-02-07 ENCOUNTER — Other Ambulatory Visit (INDEPENDENT_AMBULATORY_CARE_PROVIDER_SITE_OTHER): Payer: Self-pay | Admitting: Internal Medicine

## 2013-02-07 MED ORDER — PROPRANOLOL HCL 40 MG PO TABS
80.00 mg | ORAL_TABLET | ORAL | Status: DC | PRN
Start: 2013-02-07 — End: 2013-05-15

## 2013-02-07 NOTE — Telephone Encounter (Signed)
Rx for propranolol was sent to preferred pharmacy

## 2013-02-21 ENCOUNTER — Other Ambulatory Visit (INDEPENDENT_AMBULATORY_CARE_PROVIDER_SITE_OTHER): Payer: Self-pay | Admitting: Internal Medicine

## 2013-02-25 ENCOUNTER — Telehealth (INDEPENDENT_AMBULATORY_CARE_PROVIDER_SITE_OTHER): Payer: Self-pay | Admitting: Internal Medicine

## 2013-02-25 MED ORDER — ATORVASTATIN CALCIUM 10 MG PO TABS
10.00 mg | ORAL_TABLET | Freq: Every day | ORAL | Status: DC
Start: 2013-02-21 — End: 2013-05-15

## 2013-02-25 NOTE — Telephone Encounter (Signed)
Patient called and would like to know if you can approve more than 1 refill for lipitor?  He said the rx is working well and would like more than one refill.  Please let me know! The RX is still pending, waiting for the doctor to approve it.    Thank you!

## 2013-02-25 NOTE — Telephone Encounter (Signed)
Prescription for Lipitor was sent to patient's preferred pharmacy.

## 2013-03-18 ENCOUNTER — Other Ambulatory Visit (INDEPENDENT_AMBULATORY_CARE_PROVIDER_SITE_OTHER): Payer: Self-pay | Admitting: Internal Medicine

## 2013-03-20 MED ORDER — TRAZODONE HCL 50 MG PO TABS
50.0000 mg | ORAL_TABLET | Freq: Every day | ORAL | Status: DC
Start: 2013-03-18 — End: 2013-05-15

## 2013-03-20 NOTE — Telephone Encounter (Signed)
Prescription for trazodone was sent to patient's preferred pharmacy.  Prescription for naproxen is not appropriate - may take over the counter naproxen if needed

## 2013-03-22 ENCOUNTER — Other Ambulatory Visit (INDEPENDENT_AMBULATORY_CARE_PROVIDER_SITE_OTHER): Payer: Self-pay

## 2013-03-22 MED ORDER — OMEPRAZOLE 40 MG PO CPDR
40.0000 mg | DELAYED_RELEASE_CAPSULE | Freq: Every day | ORAL | Status: DC
Start: 2013-03-22 — End: 2013-06-12

## 2013-03-22 NOTE — Telephone Encounter (Signed)
Rx for Prilosec was sent to preferred pharmacy.

## 2013-05-15 ENCOUNTER — Encounter (INDEPENDENT_AMBULATORY_CARE_PROVIDER_SITE_OTHER): Payer: Self-pay | Admitting: Internal Medicine

## 2013-05-15 ENCOUNTER — Encounter (INDEPENDENT_AMBULATORY_CARE_PROVIDER_SITE_OTHER): Payer: Self-pay

## 2013-05-15 ENCOUNTER — Ambulatory Visit (INDEPENDENT_AMBULATORY_CARE_PROVIDER_SITE_OTHER): Payer: BLUE CROSS/BLUE SHIELD | Admitting: Internal Medicine

## 2013-05-15 ENCOUNTER — Other Ambulatory Visit (INDEPENDENT_AMBULATORY_CARE_PROVIDER_SITE_OTHER): Payer: Self-pay

## 2013-05-15 VITALS — BP 124/80 | HR 71 | Temp 97.9°F | Ht 63.0 in | Wt 173.0 lb

## 2013-05-15 DIAGNOSIS — N529 Male erectile dysfunction, unspecified: Secondary | ICD-10-CM

## 2013-05-15 DIAGNOSIS — Z8546 Personal history of malignant neoplasm of prostate: Secondary | ICD-10-CM

## 2013-05-15 HISTORY — DX: Personal history of malignant neoplasm of prostate: Z85.46

## 2013-05-15 HISTORY — DX: Male erectile dysfunction, unspecified: N52.9

## 2013-05-15 MED ORDER — HYDROCODONE-ACETAMINOPHEN 2.5-500 MG PO TABS
1.00 | ORAL_TABLET | Freq: Every day | ORAL | Status: AC | PRN
Start: 2013-05-15 — End: 2014-05-15

## 2013-05-15 MED ORDER — TRAZODONE HCL 50 MG PO TABS
50.0000 mg | ORAL_TABLET | Freq: Every day | ORAL | Status: DC
Start: 2013-05-15 — End: 2013-09-10

## 2013-05-15 MED ORDER — PROPRANOLOL HCL 40 MG PO TABS
80.0000 mg | ORAL_TABLET | ORAL | Status: DC | PRN
Start: 2013-05-15 — End: 2014-05-12

## 2013-05-15 NOTE — Progress Notes (Signed)
Subjective:       Patient ID: Carl Gibbs is a 57 y.o. male.  Chief Complaint   Patient presents with   . Hyperlipidemia     follow up       HPI  Patient is a 57 year old white male with history of gastric bypass surgery a couple of years ago, obesity, prior history of diabetes mellitus resolved after gastric bypass, performance anxiety, hyperlipidemia, hypothyroidism, obstructive sleep apnea, allergic rhinitis, and occasional headaches who presents today for followup of hyperlipidemia and hypothyroidism.  Patient states he recently had blood work with his Teacher, English as a foreign language.  Does not have any new complaints today.    Hyperlipidemia - Well controlled on last check earlier this month as below.  Has been exercising regularly.  Reports compliance w/ Lipitor 10mg  daily.  Denies myalgias or abdominal pain.      Hypothyroidism - Last TSH was done 01/2013 and was 0.82.  Reports compliance w/ levothyroxine daily.  Denies increasing fatigue, hair thinning, constipation, excessively dry skin.      Has h/o prostate cancer s/p surgical removal and is followed by urologist Dr. Evangeline Gula at [facility].  Gets penile injections for erectile dysfunction.      Has h/o insomnia and takes trazodone nightly for this.  Was previously on Ambien but was taking daily and medication was discontinued.      Has h/o performance anxiety w/ musical performances and states he is doing well on current propranolol as needed.     Gets "strong headaches" about once every 1-2 months, which he feels happen when he has a lot of nasal and sinus congestion.  Feels the only thing that takes the headaches away is Vicodin (was last given a prescription for #10 tablets in December 2013).  Denies any associated visual changes, nausea, vomiting, extremity weakness/numbness/paresthesias.      Had colonoscopy done in 2009 by GI in [place] and states it was normal.  We do not have report available.      The following portions of the patient's history were  reviewed and updated as appropriate: allergies, current medications, past medical history, past social history and problem list.  Past Medical History   Diagnosis Date   . Internal hemorrhoids 2008   . Diverticulosis 2008     by colonoscopy   . Allergy      ragweed,leaf,mold,dust,seasonal,beestings   . Diabetes mellitus 2006     type 2   . Cancer 2001-2002     prostate   . Hypertension    . Hyperlipidemia 1990   . Migraine syndrome    . Hypothyroidism    . Insomnia    . OSA (obstructive sleep apnea)    . Incomplete RBBB 2007   . Hiatal hernia 08/2011   . Erosive esophagitis 09/2011   . Treadmill stress test negative for angina pectoris 10/2011   . H/O prostate cancer 05/15/2013     Followed by Dr. Hardie Lora.  S/p surgery, no radiation   . Erectile dysfunction 05/15/2013     Gets penile injections with Urology       History     Social History   . Marital Status: Married     Spouse Name: N/A     Number of Children: N/A   . Years of Education: N/A     Occupational History   . Not on file.     Social History Main Topics   . Smoking status: Never Smoker    . Smokeless tobacco: Never  Used   . Alcohol Use: Yes   . Drug Use: No   . Sexually Active:      Other Topics Concern   . Not on file     Social History Narrative   . No narrative on file       No Known Allergies    Current Outpatient Prescriptions   Medication Sig Dispense Refill   . atorvastatin (LIPITOR) 10 MG tablet Take 1 tablet (10 mg total) by mouth daily.  90 tablet  0   . B-D INS SYR ULTRAFINE 1CC/30G 30G X 1/2" 1 ML MISC        . cyanocobalamin (VITAMIN B12) 1000 MCG/ML injection        . DYMISTA 137-50 MCG/ACT SUSP        . glucose blood test strip Contour glucose strips  100 each  1   . ibuprofen (ADVIL,MOTRIN) 800 MG tablet        . Lancets Thin MISC by Does not apply route.         Marland Kitchen levothyroxine (SYNTHROID, LEVOTHROID) 100 MCG tablet Take 1 tablet (100 mcg total) by mouth daily.  90 tablet  1   . naproxen (NAPROSYN) 375 MG tablet Take 1 tablet (375 mg total)  by mouth daily as needed.  30 tablet  2   . omeprazole (PRILOSEC) 40 MG capsule Take 1 capsule (40 mg total) by mouth daily.  30 capsule  2   . OMNARIS 50 MCG/ACT nasal spray        . propranolol (INDERAL) 40 MG tablet Take 2 tablets (80 mg total) by mouth as needed (for performance anxiety).  50 tablet  2   . SF 1.1 % GEL        . traZODone (DESYREL) 50 MG tablet Take 1 tablet (50 mg total) by mouth daily.  30 tablet  2   . HYDROcodone-acetaminophen (VICODIN) 2.5-500 MG per tablet Take 1 tablet by mouth daily as needed for Pain.  10 tablet  0   . [DISCONTINUED] CELEBREX 200 MG capsule        . [DISCONTINUED] fexofenadine (ALLEGRA) 30 MG tablet Take 30 mg by mouth daily.            Review of Systems  Per HPI      Objective:    Physical Exam   Vitals reviewed.  Constitutional: He is oriented to person, place, and time. He appears well-developed. No distress.        Obese     HENT:        Moist mucous membranes   Eyes: Conjunctivae normal are normal. Right eye exhibits no discharge. Left eye exhibits no discharge. No scleral icterus.   Cardiovascular: Normal rate and regular rhythm.  Exam reveals no gallop and no friction rub.    No murmur heard.  Pulmonary/Chest: Effort normal and breath sounds normal. No respiratory distress. He has no wheezes. He has no rales.   Abdominal: Soft. There is no tenderness. There is no rebound and no guarding.   Musculoskeletal: He exhibits no edema.   Neurological: He is alert and oriented to person, place, and time.   Skin: Skin is warm and dry. He is not diaphoretic.   Psychiatric: His behavior is normal.     BP 124/80  Pulse 71  Temp 97.9 F (36.6 C)  Ht 1.6 m (5\' 3" )  Wt 78.472 kg (173 lb)  BMI 30.65 kg/m2    Labs done May 09, 2013 were  reviewed:  Total cholesterol and it is 65, LDL 102, triglycerides 91, HDL 45    ALT 19, alkaline phosphatase 74, total bilirubin 0.5, total globulin 2.4, calcium 9.2, total protein 6.6, AST 23, CO2 26, chloride 100, potassium 4.6, sodium 141,  creatinine 0.74, BUN and 14, glucose 92, albumin 4.2    Iron 174, transferrin 242, ferritin 24    Vitamin A 60    Vitamin B12 765    CBC 6.0, hemoglobin 14.6, platelets 229    A1C 5.9%    PTH 17  Vitamin D, 25-OH 39.4      Assessment:       1. Hyperlipidemia     2. Hypothyroidism  TSH & Free T4, Serum   3. Performance anxiety  propranolol (INDERAL) 40 MG tablet   4. Headache  HYDROcodone-acetaminophen (VICODIN) 2.5-500 MG per tablet   5. Difficulty sleeping  traZODone (DESYREL) 50 MG tablet         Plan:       1.  Hyperlipidemia - Well controlled as above.  Continue current atorvastatin 10mg  daily.  Advised low-fat diet and regular exercise.      2.  Hypothyroidism - Well controlled on last TSH check .  Continue current levothyroxine daily.  Will check TFT's (future visit) as above.     3.  Performance anxiety - Doing well on propranolol as needed prior to music performances.     4.  Headaches - Discussed w/ patient that he should not take Vicodin more than 1-2 times a month for headaches and, if having more frequent headaches, needs to be seen for further evaluation and considering prophylactic therapy.       5.  Insomnia - continue current trazodone 50mg  daily.    6.  He will continue f/u with his urologist for his h/o prostate cancer and erectile dysfunction.     7.  Continue f/u with bariatric surgeon as planned.     8.  RTC in 3 months for f/u hyperlipidemia, sooner as needed.  Advised to have records from prior PCP in [place] sent to our clinic (prior to seeing Dr. Moises Blood in our clinic).

## 2013-05-15 NOTE — Progress Notes (Signed)
Has the patient sought any care outside of the [facility]? Refer to care team

## 2013-05-16 ENCOUNTER — Encounter (INDEPENDENT_AMBULATORY_CARE_PROVIDER_SITE_OTHER): Payer: Self-pay

## 2013-05-16 MED ORDER — ATORVASTATIN CALCIUM 10 MG PO TABS
10.00 mg | ORAL_TABLET | Freq: Every day | ORAL | Status: DC
Start: 2013-05-15 — End: 2013-12-02

## 2013-05-16 NOTE — Telephone Encounter (Signed)
Rx for Lipitor was sent to preferred pharmacy.

## 2013-05-17 ENCOUNTER — Ambulatory Visit (INDEPENDENT_AMBULATORY_CARE_PROVIDER_SITE_OTHER): Payer: BLUE CROSS/BLUE SHIELD

## 2013-05-18 LAB — TSH & FREE T4, SERUM
T4 Free: 1.3 ng/dL (ref 0.8–1.8)
TSH: 0.83 mIU/L (ref 0.40–4.50)

## 2013-05-21 NOTE — Progress Notes (Signed)
Pt visited office for FBW.  Labs drawn by [facility].  Pt not in distress upon leaving clinic.

## 2013-05-22 ENCOUNTER — Other Ambulatory Visit (INDEPENDENT_AMBULATORY_CARE_PROVIDER_SITE_OTHER): Payer: Self-pay | Admitting: Internal Medicine

## 2013-05-22 ENCOUNTER — Other Ambulatory Visit (INDEPENDENT_AMBULATORY_CARE_PROVIDER_SITE_OTHER): Payer: Self-pay

## 2013-05-22 MED ORDER — LEVOTHYROXINE SODIUM 100 MCG PO TABS
100.00 ug | ORAL_TABLET | Freq: Every day | ORAL | Status: DC
Start: 2013-05-22 — End: 2013-12-02

## 2013-05-27 ENCOUNTER — Ambulatory Visit (INDEPENDENT_AMBULATORY_CARE_PROVIDER_SITE_OTHER): Payer: BLUE CROSS/BLUE SHIELD | Admitting: Internal Medicine

## 2013-05-30 ENCOUNTER — Encounter (INDEPENDENT_AMBULATORY_CARE_PROVIDER_SITE_OTHER): Payer: Self-pay

## 2013-06-12 ENCOUNTER — Other Ambulatory Visit (INDEPENDENT_AMBULATORY_CARE_PROVIDER_SITE_OTHER): Payer: Self-pay | Admitting: Internal Medicine

## 2013-06-12 MED ORDER — OMEPRAZOLE 40 MG PO CPDR
40.0000 mg | DELAYED_RELEASE_CAPSULE | Freq: Every day | ORAL | Status: DC
Start: 2013-06-12 — End: 2013-08-29

## 2013-06-12 NOTE — Telephone Encounter (Signed)
Rx for Prilosec was sent to preferred pharmacy.

## 2013-07-03 ENCOUNTER — Other Ambulatory Visit (INDEPENDENT_AMBULATORY_CARE_PROVIDER_SITE_OTHER): Payer: Self-pay | Admitting: Internal Medicine

## 2013-07-04 MED ORDER — EPINEPHRINE 0.3 MG/0.3ML IJ SOAJ
0.30 mg | Freq: Once | INTRAMUSCULAR | Status: AC
Start: 2013-07-04 — End: 2013-07-04

## 2013-07-04 NOTE — Telephone Encounter (Signed)
Rx for Epipen was sent to preferred pharmacy.

## 2013-07-16 ENCOUNTER — Encounter (INDEPENDENT_AMBULATORY_CARE_PROVIDER_SITE_OTHER): Payer: Self-pay

## 2013-08-29 ENCOUNTER — Other Ambulatory Visit (INDEPENDENT_AMBULATORY_CARE_PROVIDER_SITE_OTHER): Payer: Self-pay | Admitting: Internal Medicine

## 2013-08-29 MED ORDER — OMEPRAZOLE 40 MG PO CPDR
40.0000 mg | DELAYED_RELEASE_CAPSULE | Freq: Every day | ORAL | Status: DC
Start: 2013-08-29 — End: 2013-12-02

## 2013-08-29 NOTE — Telephone Encounter (Signed)
Prescription for Prilosec was sent to preferred pharmacy

## 2013-09-10 ENCOUNTER — Other Ambulatory Visit (INDEPENDENT_AMBULATORY_CARE_PROVIDER_SITE_OTHER): Payer: Self-pay | Admitting: Internal Medicine

## 2013-09-10 MED ORDER — TRAZODONE HCL 50 MG PO TABS
50.0000 mg | ORAL_TABLET | Freq: Every day | ORAL | Status: AC
Start: 2013-09-10 — End: ?

## 2013-09-10 NOTE — Telephone Encounter (Signed)
Prescription for trazodone was sent to preferred pharmacy

## 2013-10-14 ENCOUNTER — Encounter (INDEPENDENT_AMBULATORY_CARE_PROVIDER_SITE_OTHER): Payer: Self-pay

## 2013-12-02 ENCOUNTER — Telehealth (INDEPENDENT_AMBULATORY_CARE_PROVIDER_SITE_OTHER): Payer: Self-pay | Admitting: Internal Medicine

## 2013-12-02 ENCOUNTER — Other Ambulatory Visit (INDEPENDENT_AMBULATORY_CARE_PROVIDER_SITE_OTHER): Payer: Self-pay | Admitting: Internal Medicine

## 2013-12-02 MED ORDER — OMEPRAZOLE 40 MG PO CPDR
40.0000 mg | DELAYED_RELEASE_CAPSULE | Freq: Every day | ORAL | Status: AC
Start: 2013-12-02 — End: 2014-12-02

## 2013-12-02 MED ORDER — ATORVASTATIN CALCIUM 10 MG PO TABS
10.0000 mg | ORAL_TABLET | Freq: Every day | ORAL | Status: AC
Start: 2013-12-02 — End: 2014-12-02

## 2013-12-02 MED ORDER — LEVOTHYROXINE SODIUM 100 MCG PO TABS
100.0000 ug | ORAL_TABLET | Freq: Every day | ORAL | Status: AC
Start: 2013-12-02 — End: 2014-12-02

## 2013-12-02 NOTE — Telephone Encounter (Signed)
Pt walked into the office wanting to see you today. You were completely booked so I offered him an appt for Wednesday but he said he's going back to [place] tomorrow. Pt wanted to know if he could get a refill for omeprazole (PRILOSEC) 40 MG capsule, levothyroxine (SYNTHROID, LEVOTHROID) 100 MCG tablet and atorvastatin (LIPITOR) 10 MG tablet sent to his local [facility].    Thanks,  Lafonda Mosses

## 2013-12-02 NOTE — Telephone Encounter (Signed)
Prescriptions for #14 days w/o refills of requested medications was sent to preferred pharmacy while patient is seen in [place].  He has not had any blood work since June and needs to have this done.  Please notify patient.  Thanks.

## 2013-12-31 ENCOUNTER — Encounter (INDEPENDENT_AMBULATORY_CARE_PROVIDER_SITE_OTHER): Payer: Self-pay | Admitting: Internal Medicine

## 2013-12-31 DIAGNOSIS — G479 Sleep disorder, unspecified: Secondary | ICD-10-CM

## 2014-01-01 ENCOUNTER — Other Ambulatory Visit (INDEPENDENT_AMBULATORY_CARE_PROVIDER_SITE_OTHER): Payer: Self-pay | Admitting: Internal Medicine

## 2014-01-01 DIAGNOSIS — G479 Sleep disorder, unspecified: Secondary | ICD-10-CM

## 2014-01-01 NOTE — Telephone Encounter (Signed)
Patient has not been seen in clinic since June 2014 and has since moved to [place].  Please notify him that he needs to f/u with new pcp in [place] for this.  Thanks.

## 2014-01-01 NOTE — Telephone Encounter (Signed)
Left message stating per Dr. Yancey Flemings he will need to gets refill from PCP in [place].

## 2014-01-08 ENCOUNTER — Encounter (INDEPENDENT_AMBULATORY_CARE_PROVIDER_SITE_OTHER): Payer: Self-pay

## 2014-03-04 ENCOUNTER — Other Ambulatory Visit (INDEPENDENT_AMBULATORY_CARE_PROVIDER_SITE_OTHER): Payer: Self-pay

## 2014-03-04 DIAGNOSIS — G479 Sleep disorder, unspecified: Secondary | ICD-10-CM

## 2014-03-05 NOTE — Telephone Encounter (Signed)
Patient has not been seen in clinic since summer 2014.  He has since moved to [place].  As patient has not been seen recently prescription for trazodone will not be sent.

## 2014-03-05 NOTE — Telephone Encounter (Signed)
Patient notified he felt this was sent to [place] in error.

## 2014-05-12 ENCOUNTER — Other Ambulatory Visit (INDEPENDENT_AMBULATORY_CARE_PROVIDER_SITE_OTHER): Payer: Self-pay

## 2014-05-12 DIAGNOSIS — F418 Other specified anxiety disorders: Secondary | ICD-10-CM

## 2014-05-12 MED ORDER — PROPRANOLOL HCL 40 MG PO TABS
80.0000 mg | ORAL_TABLET | ORAL | Status: AC | PRN
Start: 2014-05-12 — End: 2015-05-12

## 2016-10-10 HISTORY — PX: FUNCTIONAL ENDOSCOPIC SINUS SURGERY: SUR616

## 2020-07-19 ENCOUNTER — Emergency Department (HOSPITAL_COMMUNITY): Payer: Medicare Other

## 2020-07-19 ENCOUNTER — Other Ambulatory Visit: Payer: Self-pay

## 2020-07-19 ENCOUNTER — Encounter (HOSPITAL_COMMUNITY): Payer: Self-pay

## 2020-07-19 ENCOUNTER — Emergency Department (HOSPITAL_COMMUNITY)
Admission: EM | Admit: 2020-07-19 | Discharge: 2020-07-19 | Disposition: A | Discharge status: Home / Self Care | Payer: Medicare Other | Attending: Emergency Medicine | Admitting: Emergency Medicine

## 2020-07-19 DIAGNOSIS — W228XXA Striking against or struck by other objects, initial encounter: Secondary | ICD-10-CM | POA: Insufficient documentation

## 2020-07-19 DIAGNOSIS — Y9389 Activity, other specified: Secondary | ICD-10-CM | POA: Insufficient documentation

## 2020-07-19 DIAGNOSIS — S022XXA Fracture of nasal bones, initial encounter for closed fracture: Secondary | ICD-10-CM | POA: Insufficient documentation

## 2020-07-19 DIAGNOSIS — Y9289 Other specified places as the place of occurrence of the external cause: Secondary | ICD-10-CM | POA: Diagnosis not present

## 2020-07-19 DIAGNOSIS — Y999 Unspecified external cause status: Secondary | ICD-10-CM | POA: Insufficient documentation

## 2020-07-19 DIAGNOSIS — F17290 Nicotine dependence, other tobacco product, uncomplicated: Secondary | ICD-10-CM | POA: Diagnosis not present

## 2020-07-19 DIAGNOSIS — R040 Epistaxis: Secondary | ICD-10-CM

## 2020-07-19 DIAGNOSIS — S01511A Laceration without foreign body of lip, initial encounter: Secondary | ICD-10-CM

## 2020-07-19 DIAGNOSIS — S0992XA Unspecified injury of nose, initial encounter: Secondary | ICD-10-CM | POA: Diagnosis present

## 2020-07-19 MED ORDER — ACETAMINOPHEN 325 MG PO TABS
650.0000 mg | ORAL_TABLET | Freq: Once | ORAL | Status: AC
  Administered 2020-07-19: 650 mg via ORAL
  Filled 2020-07-19: qty 2

## 2020-07-19 MED ORDER — AMOXICILLIN-POT CLAVULANATE 875-125 MG PO TABS: 1.0000 | ORAL_TABLET | Freq: Two times a day (BID) | ORAL | 0 refills | Status: AC

## 2020-07-19 MED ORDER — HYDROCODONE-ACETAMINOPHEN 5-325 MG PO TABS: 1.0000 | ORAL_TABLET | Freq: Three times a day (TID) | ORAL | 0 refills | Status: AC | PRN

## 2020-07-19 MED ORDER — LIDOCAINE HCL (PF) 1 % IJ SOLN
5.0000 mL | Freq: Once | INTRAMUSCULAR | Status: AC
  Administered 2020-07-19: 5 mL
  Filled 2020-07-19: qty 30

## 2020-07-19 MED ORDER — OXYCODONE-ACETAMINOPHEN 5-325 MG PO TABS
1.0000 | ORAL_TABLET | Freq: Once | ORAL | Status: AC
  Administered 2020-07-19: 1 via ORAL
  Filled 2020-07-19: qty 1

## 2020-07-19 MED ORDER — OXYMETAZOLINE HCL 0.05 % NA SOLN
1.0000 | Freq: Once | NASAL | Status: AC
  Administered 2020-07-19: 1 via NASAL
  Filled 2020-07-19: qty 30

## 2020-07-19 NOTE — ED Triage Notes (Signed)
EMS reports from softball game, was struck in face with backswing of bat. Pt had initial epistaxis, bleeding controlled on arrival. No obvious visual injury to face. Denies blood thinner.  BP 160/70 HR 84 RR 16 Sp02 99 RA CBG 131

## 2020-07-19 NOTE — Consult Note (Signed)
Reason for Consult: Nasal fracture Referring Physician: Emergency room  Ricardo Cole is an 64 y.o. male.  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  He has a lot of swelling across his dorsum.  He has had no further epistaxis since initially.  He does have some congestion of the nose especially on the left side.  He has no vision issues or diplopia.  No malocclusion.  His CT scan showed a comminuted nasal fracture.  History reviewed. No pertinent past medical history.  History reviewed. No pertinent surgical history.  History reviewed. No pertinent family history.  Social History:  reports that he has been smoking cigars. He has never used smokeless tobacco. No history on file for alcohol use and drug use.  Allergies: No Known Allergies  Medications: I have reviewed the patient's current medications.  No results found for this or any previous visit (from the past 48 hour(s)).  CT Head Wo Contrast  Result Date: 07/19/2020 CLINICAL DATA:  Status post trauma. EXAM: CT HEAD WITHOUT CONTRAST TECHNIQUE: Contiguous axial images were obtained from the base of the skull through the vertex without intravenous contrast. COMPARISON:  None. FINDINGS: Brain: There is mild cerebral atrophy with widening of the extra-axial spaces and ventricular dilatation. There are areas of decreased attenuation within the white matter tracts of the supratentorial brain, consistent with microvascular disease changes. Vascular: No hyperdense vessel or unexpected calcification. Skull: Acute bilateral comminuted nasal bone fractures are seen with moderate severity bilateral paranasal soft tissue swelling. Negative for skull fracture or focal lesion. Sinuses/Orbits: Chronic and postoperative changes are seen along the medial walls of the bilateral maxillary sinuses. Mild diffuse cortical thickening of the right maxillary sinus is also seen. Other: None. IMPRESSION: 1. Acute bilateral comminuted  nasal bone fractures with moderate severity bilateral paranasal soft tissue swelling. 2. No acute intracranial abnormality. Electronically Signed   By: Virgina Norfolk M.D.   On: 07/19/2020 15:04   CT Maxillofacial Wo Contrast  Result Date: 07/19/2020 CLINICAL DATA:  Status post trauma. EXAM: CT MAXILLOFACIAL WITHOUT CONTRAST TECHNIQUE: Multidetector CT imaging of the maxillofacial structures was performed. Multiplanar CT image reconstructions were also generated. COMPARISON:  None. FINDINGS: Osseous: Acute bilateral comminuted nasal bone fractures are seen. Orbits: Negative. No traumatic or inflammatory finding. Sinuses: Chronic and postoperative changes are seen along the medial walls of the bilateral maxillary sinuses with mild to moderate severity left maxillary sinus mucosal thickening. Chronic diffuse cortical thickening of the right maxillary sinus is also noted. Soft tissues: There is moderate severity bilateral paranasal soft tissue swelling, left greater than right. Limited intracranial: No significant or unexpected finding. IMPRESSION: 1. Acute bilateral comminuted nasal bone fractures. 2. Chronic and postoperative changes of the bilateral maxillary sinuses. 3. Moderate severity bilateral paranasal soft tissue swelling, left greater than right. Electronically Signed   By: Virgina Norfolk M.D.   On: 07/19/2020 15:06    Review of Systems Blood pressure (!) 162/96, pulse 75, temperature 97.9 F (36.6 C), temperature source Oral, resp. rate 14, SpO2 95 %. Physical Exam HENT:     Head: Normocephalic.     Comments: The nose is swollen across the entire dorsum but more on the right.  The septum is deviated to the left but there is no evidence of septal hematoma.  He is occlusion is normal with no ecchymosis or swelling.  His pupils are equally round reactive to light and extraocular motor muscles are intact and there is no evidence of  orbital injury.    Right Ear: Tympanic membrane normal.      Left Ear: Tympanic membrane normal.  Musculoskeletal:     Cervical back: Normal range of motion and neck supple.  Neurological:     Mental Status: He is alert.     Assessment/Plan: Nasal fracture-he is significantly swollen across the dorsum and likely has a deviation but cannot be determined at this point by palpation.  The septum does not have a septal hematoma but is deviated to the left.  There is no bleeding.  We talked about the indications for close reduction and nasal fracture and that the swelling needs to go down before that can be determined.  He will place ice for 24 to 48 hours.  He will use Afrin spray for only 2-3 days maximum.  He will use saline to irrigate the nose out and no nose blowing.  He should follow-up in 5-7 days with [facility] ear nose and throat  Melissa Montane 07/19/2020, 5:20 PM
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
## 2020-07-19 NOTE — Discharge Instructions (Addendum)
Please take your antibiotics as prescribed.  Please follow all recommendations given by the ear nose and throat doctor. Please keep head of bed elevated somewhat and refrain from blowing her nose forcefully.  Your lip laceration will heal.  It was repaired with dissolvable stitches will not require removal.  Please follow-up with your PCP for wound check.  Parents her mouth out with warm saline and use petroleum jelly on the outer lip to promote good wound healing.  Keep area clean with warm soapy water.  Dab dry.

## 2020-07-24 ENCOUNTER — Other Ambulatory Visit: Payer: Self-pay | Admitting: Otolaryngology

## 2020-07-25 ENCOUNTER — Other Ambulatory Visit (HOSPITAL_COMMUNITY)
Admission: RE | Admit: 2020-07-25 | Discharge: 2020-07-25 | Disposition: A | Discharge status: Home / Self Care | Payer: Medicare Other | Source: Ambulatory Visit | Attending: Otolaryngology | Admitting: Otolaryngology

## 2020-07-25 DIAGNOSIS — Z20822 Contact with and (suspected) exposure to covid-19: Secondary | ICD-10-CM | POA: Insufficient documentation

## 2020-07-25 DIAGNOSIS — Z01812 Encounter for preprocedural laboratory examination: Secondary | ICD-10-CM | POA: Insufficient documentation

## 2020-07-25 LAB — SARS CORONAVIRUS 2 (TAT 6-24 HRS): SARS Coronavirus 2: NEGATIVE

## 2020-07-27 ENCOUNTER — Encounter (HOSPITAL_BASED_OUTPATIENT_CLINIC_OR_DEPARTMENT_OTHER)
Admission: RE | Disposition: A | Discharge status: Home / Self Care | Payer: Self-pay | Source: Home / Self Care | Attending: Otolaryngology

## 2020-07-27 ENCOUNTER — Other Ambulatory Visit: Payer: Self-pay

## 2020-07-27 ENCOUNTER — Ambulatory Visit (HOSPITAL_BASED_OUTPATIENT_CLINIC_OR_DEPARTMENT_OTHER)
Admission: RE | Admit: 2020-07-27 | Discharge: 2020-07-27 | Disposition: A | Discharge status: Home / Self Care | Payer: Medicare Other | Attending: Otolaryngology | Admitting: Otolaryngology

## 2020-07-27 ENCOUNTER — Ambulatory Visit (HOSPITAL_BASED_OUTPATIENT_CLINIC_OR_DEPARTMENT_OTHER): Payer: Medicare Other | Admitting: Certified Registered"

## 2020-07-27 ENCOUNTER — Encounter (HOSPITAL_BASED_OUTPATIENT_CLINIC_OR_DEPARTMENT_OTHER): Payer: Self-pay | Admitting: Otolaryngology

## 2020-07-27 DIAGNOSIS — G4730 Sleep apnea, unspecified: Secondary | ICD-10-CM | POA: Diagnosis not present

## 2020-07-27 DIAGNOSIS — W2111XA Struck by baseball bat, initial encounter: Secondary | ICD-10-CM | POA: Diagnosis not present

## 2020-07-27 DIAGNOSIS — E119 Type 2 diabetes mellitus without complications: Secondary | ICD-10-CM | POA: Insufficient documentation

## 2020-07-27 DIAGNOSIS — Y92320 Baseball field as the place of occurrence of the external cause: Secondary | ICD-10-CM | POA: Insufficient documentation

## 2020-07-27 DIAGNOSIS — Z79899 Other long term (current) drug therapy: Secondary | ICD-10-CM | POA: Insufficient documentation

## 2020-07-27 DIAGNOSIS — K219 Gastro-esophageal reflux disease without esophagitis: Secondary | ICD-10-CM | POA: Diagnosis not present

## 2020-07-27 DIAGNOSIS — Z96653 Presence of artificial knee joint, bilateral: Secondary | ICD-10-CM | POA: Diagnosis not present

## 2020-07-27 DIAGNOSIS — Y9389 Activity, other specified: Secondary | ICD-10-CM | POA: Diagnosis not present

## 2020-07-27 DIAGNOSIS — F17290 Nicotine dependence, other tobacco product, uncomplicated: Secondary | ICD-10-CM | POA: Insufficient documentation

## 2020-07-27 DIAGNOSIS — Z8546 Personal history of malignant neoplasm of prostate: Secondary | ICD-10-CM | POA: Insufficient documentation

## 2020-07-27 DIAGNOSIS — S022XXA Fracture of nasal bones, initial encounter for closed fracture: Secondary | ICD-10-CM | POA: Diagnosis present

## 2020-07-27 DIAGNOSIS — F17200 Nicotine dependence, unspecified, uncomplicated: Secondary | ICD-10-CM | POA: Insufficient documentation

## 2020-07-27 HISTORY — DX: Sleep apnea, unspecified: G47.30

## 2020-07-27 HISTORY — DX: Gastro-esophageal reflux disease without esophagitis: K21.9

## 2020-07-27 HISTORY — DX: Malignant (primary) neoplasm, unspecified: C80.1

## 2020-07-27 HISTORY — PX: CLOSED REDUCTION NASAL FRACTURE: SHX5365

## 2020-07-27 HISTORY — DX: Type 2 diabetes mellitus without complications: E11.9

## 2020-07-27 SURGERY — CLOSED REDUCTION, FRACTURE, NASAL BONE
Anesthesia: General | Site: Nose

## 2020-07-27 MED ORDER — DEXAMETHASONE SODIUM PHOSPHATE 10 MG/ML IJ SOLN
INTRAMUSCULAR | Status: DC | PRN
  Administered 2020-07-27: 5 mg via INTRAVENOUS

## 2020-07-27 MED ORDER — FENTANYL CITRATE (PF) 100 MCG/2ML IJ SOLN
INTRAMUSCULAR | Status: AC
  Filled 2020-07-27: qty 2

## 2020-07-27 MED ORDER — PROPOFOL 10 MG/ML IV BOLUS
INTRAVENOUS | Status: DC | PRN
  Administered 2020-07-27: 200 mg via INTRAVENOUS

## 2020-07-27 MED ORDER — OXYCODONE HCL 5 MG PO TABS
ORAL_TABLET | ORAL | Status: AC
  Filled 2020-07-27: qty 1

## 2020-07-27 MED ORDER — MIDAZOLAM HCL 5 MG/5ML IJ SOLN
INTRAMUSCULAR | Status: DC | PRN
  Administered 2020-07-27: 2 mg via INTRAVENOUS

## 2020-07-27 MED ORDER — PROPOFOL 10 MG/ML IV BOLUS
INTRAVENOUS | Status: AC
  Filled 2020-07-27: qty 20

## 2020-07-27 MED ORDER — MIDAZOLAM HCL 2 MG/2ML IJ SOLN
INTRAMUSCULAR | Status: AC
  Filled 2020-07-27: qty 2

## 2020-07-27 MED ORDER — ONDANSETRON HCL 4 MG/2ML IJ SOLN
INTRAMUSCULAR | Status: DC | PRN
  Administered 2020-07-27: 4 mg via INTRAVENOUS

## 2020-07-27 MED ORDER — LACTATED RINGERS IV SOLN: INTRAVENOUS | Status: DC

## 2020-07-27 MED ORDER — ACETAMINOPHEN 500 MG PO TABS
1000.0000 mg | ORAL_TABLET | Freq: Once | ORAL | Status: AC
  Administered 2020-07-27: 1000 mg via ORAL

## 2020-07-27 MED ORDER — OXYMETAZOLINE HCL 0.05 % NA SOLN
NASAL | Status: AC
  Filled 2020-07-27: qty 30

## 2020-07-27 MED ORDER — FENTANYL CITRATE (PF) 100 MCG/2ML IJ SOLN
INTRAMUSCULAR | Status: DC | PRN
Start: 2020-07-27 — End: 2020-07-27
  Administered 2020-07-27 (×2): 50 ug via INTRAVENOUS

## 2020-07-27 MED ORDER — ACETAMINOPHEN 500 MG PO TABS
ORAL_TABLET | ORAL | Status: AC
  Filled 2020-07-27: qty 2

## 2020-07-27 MED ORDER — OXYMETAZOLINE HCL 0.05 % NA SOLN
NASAL | Status: DC | PRN
  Administered 2020-07-27: 1 via TOPICAL

## 2020-07-27 MED ORDER — DEXAMETHASONE SODIUM PHOSPHATE 10 MG/ML IJ SOLN
INTRAMUSCULAR | Status: AC
  Filled 2020-07-27: qty 1

## 2020-07-27 MED ORDER — ONDANSETRON HCL 4 MG/2ML IJ SOLN
INTRAMUSCULAR | Status: AC
  Filled 2020-07-27: qty 2

## 2020-07-27 MED ORDER — FENTANYL CITRATE (PF) 100 MCG/2ML IJ SOLN
25.0000 ug | INTRAMUSCULAR | Status: DC | PRN
  Administered 2020-07-27: 25 ug via INTRAVENOUS

## 2020-07-27 MED ORDER — LIDOCAINE 2% (20 MG/ML) 5 ML SYRINGE
INTRAMUSCULAR | Status: AC
  Filled 2020-07-27: qty 5

## 2020-07-27 MED ORDER — OXYCODONE HCL 5 MG PO TABS
5.0000 mg | ORAL_TABLET | Freq: Once | ORAL | Status: AC | PRN
  Administered 2020-07-27: 5 mg via ORAL

## 2020-07-27 SURGICAL SUPPLY — 33 items
APL SKNCLS STERI-STRIP NONHPOA (GAUZE/BANDAGES/DRESSINGS) ×1
BENZOIN TINCTURE PRP APPL 2/3 (GAUZE/BANDAGES/DRESSINGS) ×3 IMPLANT
CANISTER SUCT 1200ML W/VALVE (MISCELLANEOUS) ×3 IMPLANT
CLOSURE WOUND 1/2 X4 (GAUZE/BANDAGES/DRESSINGS) ×1
CLOSURE WOUND 1/4X4 (GAUZE/BANDAGES/DRESSINGS)
CNTNR URN SCR LID CUP LEK RST (MISCELLANEOUS) ×1 IMPLANT
CONT SPEC 4OZ STRL OR WHT (MISCELLANEOUS) ×3
COVER BACK TABLE 60X90IN (DRAPES) ×3 IMPLANT
COVER MAYO STAND STRL (DRAPES) ×3 IMPLANT
DECANTER SPIKE VIAL GLASS SM (MISCELLANEOUS) IMPLANT
DEPRESSOR TONGUE BLADE STERILE (MISCELLANEOUS) IMPLANT
DRESSING NASAL KENNEDY 3.5X.9 (MISCELLANEOUS) IMPLANT
DRSG CURAD 3X16 NADH (PACKING) IMPLANT
DRSG NASAL KENNEDY 3.5X.9 (MISCELLANEOUS)
DRSG TELFA 3X8 NADH (GAUZE/BANDAGES/DRESSINGS) IMPLANT
GAUZE PACKING IODOFORM 1/2 (PACKING) IMPLANT
GAUZE SPONGE 4X4 12PLY STRL LF (GAUZE/BANDAGES/DRESSINGS) ×3 IMPLANT
GLOVE BIO SURGEON STRL SZ7.5 (GLOVE) ×3 IMPLANT
NEEDLE PRECISIONGLIDE 27X1.5 (NEEDLE) ×3 IMPLANT
PATTIES SURGICAL .5 X3 (DISPOSABLE) ×3 IMPLANT
SHEET MEDIUM DRAPE 40X70 STRL (DRAPES) ×3 IMPLANT
SHEET SILICONE 2X3 0.03 REINF (MISCELLANEOUS) IMPLANT
SPLINT NASAL THERMO PLAST (MISCELLANEOUS) ×3 IMPLANT
SPONGE GAUZE 2X2 8PLY STER LF (GAUZE/BANDAGES/DRESSINGS) ×1
SPONGE GAUZE 2X2 8PLY STRL LF (GAUZE/BANDAGES/DRESSINGS) ×2 IMPLANT
STRIP CLOSURE SKIN 1/2X4 (GAUZE/BANDAGES/DRESSINGS) ×2 IMPLANT
STRIP CLOSURE SKIN 1/4X4 (GAUZE/BANDAGES/DRESSINGS) IMPLANT
SUT ETHILON 3 0 PS 1 (SUTURE) IMPLANT
SYR CONTROL 10ML LL (SYRINGE) ×3 IMPLANT
TOWEL GREEN STERILE FF (TOWEL DISPOSABLE) ×3 IMPLANT
TUBE CONNECTING 20'X1/4 (TUBING) ×1
TUBE CONNECTING 20X1/4 (TUBING) ×2 IMPLANT
YANKAUER SUCT BULB TIP NO VENT (SUCTIONS) IMPLANT

## 2020-07-27 NOTE — Discharge Instructions (Signed)
  Post Anesthesia Home Care Instructions  Activity: Get plenty of rest for the remainder of the day. A responsible individual must stay with you for 24 hours following the procedure.  For the next 24 hours, DO NOT: -Drive a car -Paediatric nurse -Drink alcoholic beverages -Take any medication unless instructed by your physician -Make any legal decisions or sign important papers.  Meals: Start with liquid foods such as gelatin or soup. Progress to regular foods as tolerated. Avoid greasy, spicy, heavy foods. If nausea and/or vomiting occur, drink only clear liquids until the nausea and/or vomiting subsides. Call your physician if vomiting continues.  Special Instructions/Symptoms: Your throat may feel dry or sore from the anesthesia or the breathing tube placed in your throat during surgery. If this causes discomfort, gargle with warm salt water. The discomfort should disappear within 24 hours.  If you had a scopolamine patch placed behind your ear for the management of post- operative nausea and/or vomiting:  1. The medication in the patch is effective for 72 hours, after which it should be removed.  Wrap patch in a tissue and discard in the trash. Wash hands thoroughly with soap and water. 2. You may remove the patch earlier than 72 hours if you experience unpleasant side effects which may include dry mouth, dizziness or visual disturbances. 3. Avoid touching the patch. Wash your hands with soap and water after contact with the patch.  May take Tylenol after 4:30 pm, if needed.

## 2020-07-27 NOTE — Anesthesia Preprocedure Evaluation (Addendum)
Anesthesia Evaluation  Patient identified by MRN, date of birth, ID band Patient awake    Reviewed: Allergy & Precautions, H&P , NPO status , Patient's Chart, lab work & pertinent test results  History of Anesthesia Complications (+) DIFFICULT AIRWAY  Airway Mallampati: III  TM Distance: >3 FB Neck ROM: Full  Mouth opening: Limited Mouth Opening  Dental no notable dental hx. (+) Teeth Intact, Dental Advisory Given   Pulmonary Current Smoker and Patient abstained from smoking.,    Pulmonary exam normal breath sounds clear to auscultation       Cardiovascular negative cardio ROS   Rhythm:Regular Rate:Normal     Neuro/Psych negative neurological ROS  negative psych ROS   GI/Hepatic negative GI ROS, Neg liver ROS,   Endo/Other  diabetes  Renal/GU negative Renal ROS  negative genitourinary   Musculoskeletal   Abdominal   Peds  Hematology negative hematology ROS (+)   Anesthesia Other Findings   Reproductive/Obstetrics negative OB ROS                            Anesthesia Physical Anesthesia Plan  ASA: II  Anesthesia Plan: General   Post-op Pain Management:    Induction: Intravenous  PONV Risk Score and Plan: 2 and Ondansetron, Dexamethasone and Midazolam  Airway Management Planned: LMA  Additional Equipment:   Intra-op Plan:   Post-operative Plan: Extubation in OR  Informed Consent: I have reviewed the patients History and Physical, chart, labs and discussed the procedure including the risks, benefits and alternatives for the proposed anesthesia with the patient or authorized representative who has indicated his/her understanding and acceptance.     Dental advisory given  Plan Discussed with: CRNA  Anesthesia Plan Comments:         Anesthesia Quick Evaluation
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
## 2020-07-27 NOTE — Anesthesia Procedure Notes (Signed)
Procedure Name: LMA Insertion Date/Time: 07/27/2020 1:45 PM Performed by: Maryella Shivers, CRNA Pre-anesthesia Checklist: Patient identified, Emergency Drugs available, Suction available and Patient being monitored Patient Re-evaluated:Patient Re-evaluated prior to induction Oxygen Delivery Method: Circle system utilized Preoxygenation: Pre-oxygenation with 100% oxygen Induction Type: IV induction Ventilation: Mask ventilation without difficulty LMA: LMA inserted LMA Size: 5.0 Number of attempts: 1 Airway Equipment and Method: Bite block Placement Confirmation: positive ETCO2 Tube secured with: Tape Dental Injury: Teeth and Oropharynx as per pre-operative assessment

## 2020-07-27 NOTE — Transfer of Care (Signed)
Immediate Anesthesia Transfer of Care Note  Patient: Ricardo Cole  Procedure(s) Performed: CLOSED REDUCTION NASAL FRACTURE (N/A Nose)  Patient Location: PACU  Anesthesia Type:General  Level of Consciousness: sedated  Airway & Oxygen Therapy: Patient Spontanous Breathing and Patient connected to face mask oxygen  Post-op Assessment: Report given to RN and Post -op Vital signs reviewed and stable  Post vital signs: Reviewed and stable  Last Vitals:  Vitals Value Taken Time  BP    Temp    Pulse 62 07/27/20 1407  Resp 14 07/27/20 1407  SpO2 99 % 07/27/20 1407  Vitals shown include unvalidated device data.  Last Pain:  Vitals:   07/27/20 1033  TempSrc: Oral  PainSc: 3          Complications: No complications documented.

## 2020-07-27 NOTE — Op Note (Signed)
Preop diagnosis: Displaced nasal fracture Postop diagnosis: same Procedure: Closed nasal reduction Surgeon: Redmond Baseman Anesth: General Compl: None Findings: Right nasal bone outward displaced. Description:  After discussing risks, benefits, and alternatives, the patient was brought to the operating room and placed on the operative table in the supine position.  Anesthesia was induced and the patient was intubated by the anesthesia team with an LMA without difficulty.  The eyes were taped closed.  Afrin-soaked pledgets were placed in both sides of the nose for several minutes.  After removal, the nose was reduced using a Goldman elevator with bimanual manipulation until the nasal bones looked symmetric.  Afrin-soaked pledgets were replaced.  The external nose was painted with Benzoin and custom-cut Steri-strips were laid over the nose.  The Thermaplast splint was cut to fit the nose and placed in hot water until malleable.  It was then laid over the nose until it hardened in place.  Pledgets were removed and the nasal passages were suctioned.  The patient was returned to anesthesia for wake up and was extubated and moved to the recovery room in stable condition.

## 2020-07-27 NOTE — Anesthesia Postprocedure Evaluation (Signed)
Anesthesia Post Note  Patient: Ricardo Cole  Procedure(s) Performed: CLOSED REDUCTION NASAL FRACTURE (N/A Nose)     Patient location during evaluation: PACU Anesthesia Type: General Level of consciousness: awake Pain management: pain level controlled Vital Signs Assessment: post-procedure vital signs reviewed and stable Respiratory status: spontaneous breathing, nonlabored ventilation, respiratory function stable and patient connected to nasal cannula oxygen Cardiovascular status: blood pressure returned to baseline and stable Postop Assessment: no apparent nausea or vomiting Anesthetic complications: no   No complications documented.  Last Vitals:  Vitals:   07/27/20 1500 07/27/20 1515  BP: 140/85   Pulse: (!) 59   Resp: 13 17  Temp: 36.7 C   SpO2: 98%     Last Pain:  Vitals:   07/27/20 1500  TempSrc:   PainSc: 3                  Rosangelica Pevehouse P Tangy Drozdowski

## 2020-07-27 NOTE — Brief Op Note (Signed)
07/27/2020  1:53 PM  PATIENT:  Ricardo Cole  64 y.o. male  PRE-OPERATIVE DIAGNOSIS:  NASAL FRACTURE  POST-OPERATIVE DIAGNOSIS:  NASAL FRACTURE  PROCEDURE:  Procedure(s): CLOSED REDUCTION NASAL FRACTURE (N/A)  SURGEON:  Surgeon(s) and Role:    * Melida Quitter, MD - Primary  PHYSICIAN ASSISTANT:   ASSISTANTS: none   ANESTHESIA:   general  EBL: Minimal  BLOOD ADMINISTERED:none  DRAINS: none   LOCAL MEDICATIONS USED:  NONE  SPECIMEN:  No Specimen  DISPOSITION OF SPECIMEN:  N/A  COUNTS:  YES  TOURNIQUET:  * No tourniquets in log *  DICTATION: .Note written in EPIC  PLAN OF CARE: Discharge to home after PACU  PATIENT DISPOSITION:  PACU - hemodynamically stable.   Delay start of Pharmacological VTE agent (>24hrs) due to surgical blood loss or risk of bleeding: no

## 2020-07-28 ENCOUNTER — Encounter (HOSPITAL_BASED_OUTPATIENT_CLINIC_OR_DEPARTMENT_OTHER): Payer: Self-pay | Admitting: Otolaryngology

## 2021-07-09 IMAGING — CT CT HEAD W/O CM
3 series · 15 of 47 positions shown, 18 images · non-contrast
Comparison: None.

CLINICAL DATA: Status post trauma.

EXAM:
CT HEAD WITHOUT CONTRAST
TECHNIQUE: Contiguous axial images were obtained from the base of the skull
through the vertex without intravenous contrast.

[Series 3: head wo · axial · 0.47mm/px · z∈[-130,+5]mm · 9 of 33 slices shown, 12 images]
[im 3/33  brain]
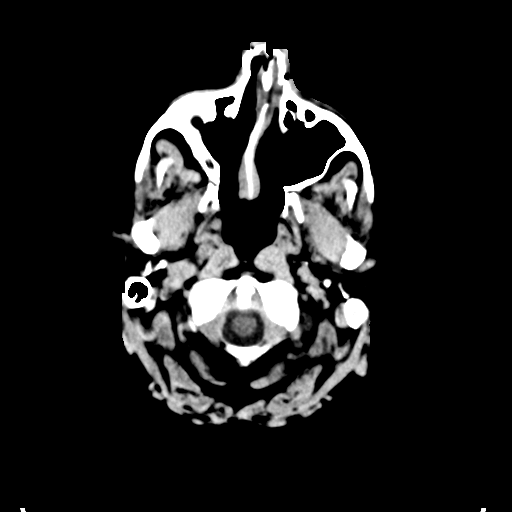
[im 3/33  bone]
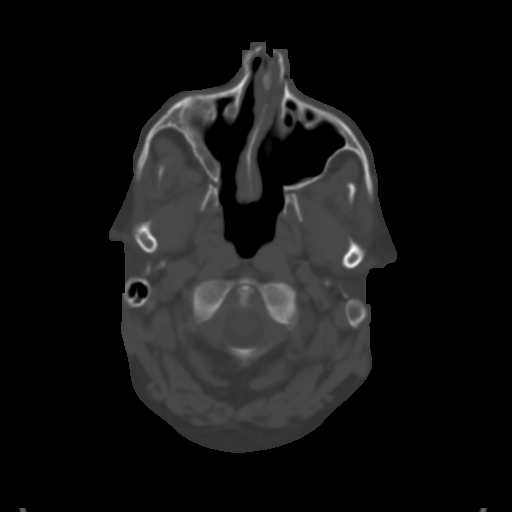
[im 6/33  brain]
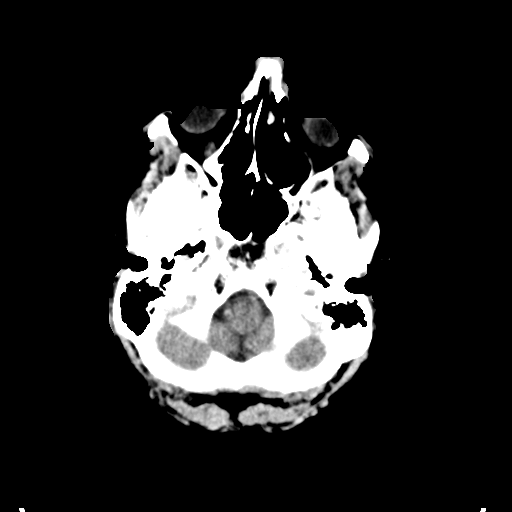
[im 9/33  brain]
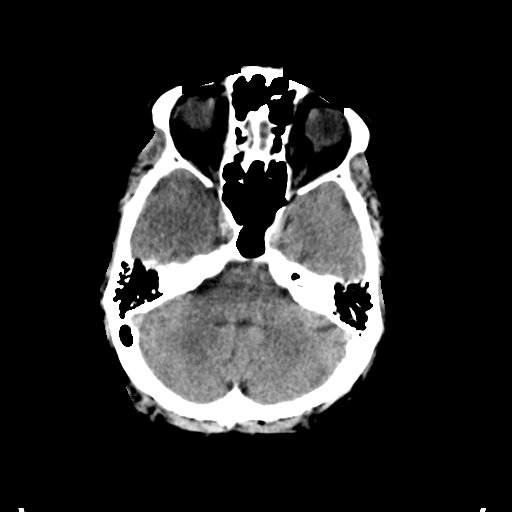
[im 13/33  brain]
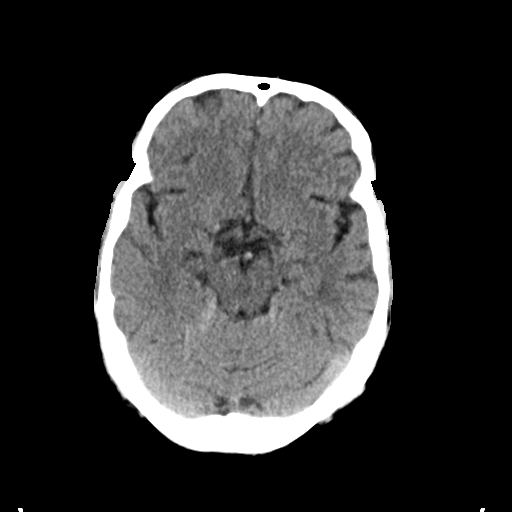
[im 17/33  brain]
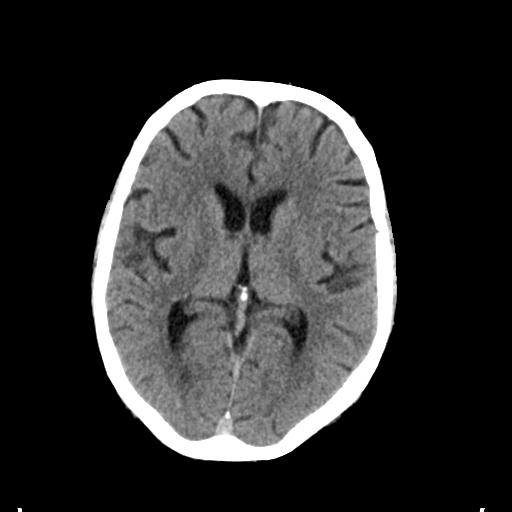
[im 17/33  bone]
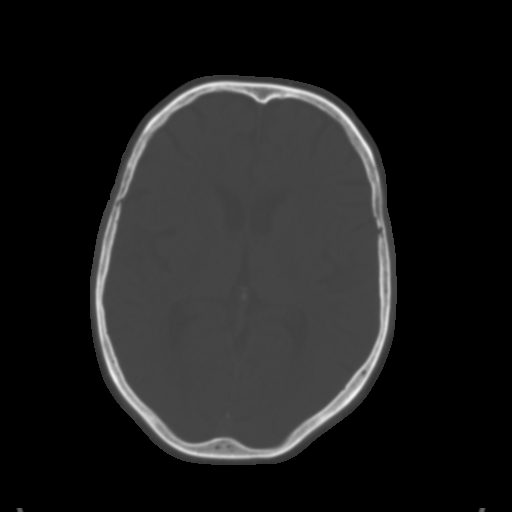
[im 20/33  brain]
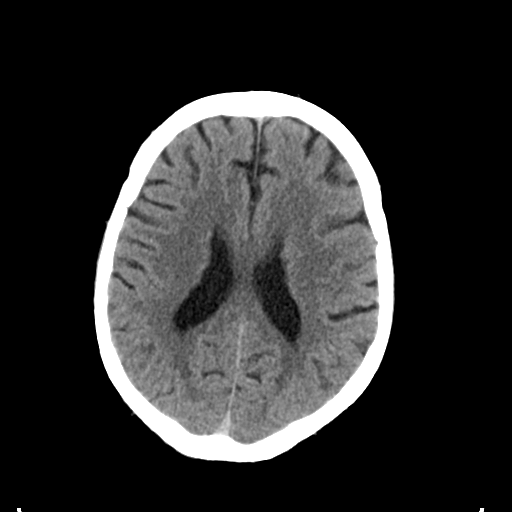
[im 24/33  brain]
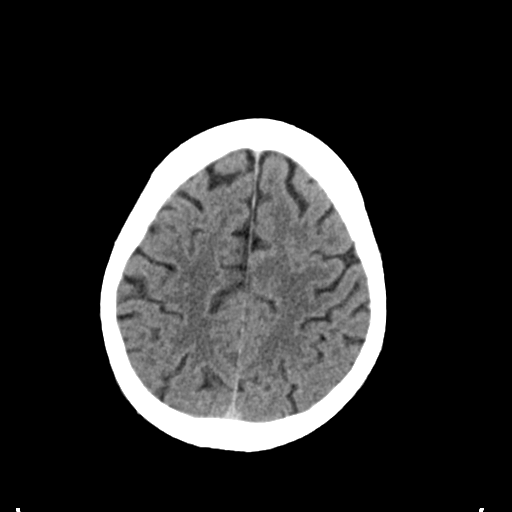
[im 27/33  brain]
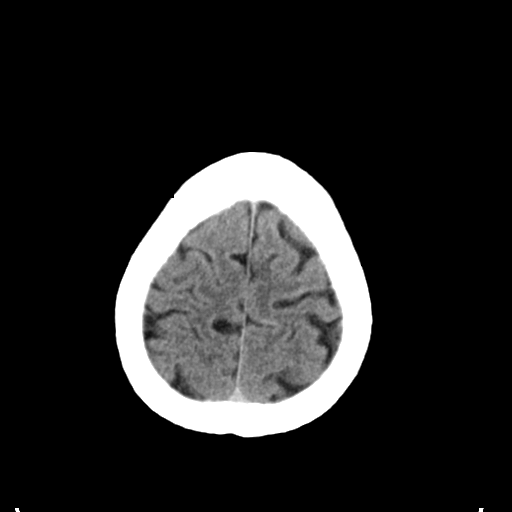
[im 30/33  brain]
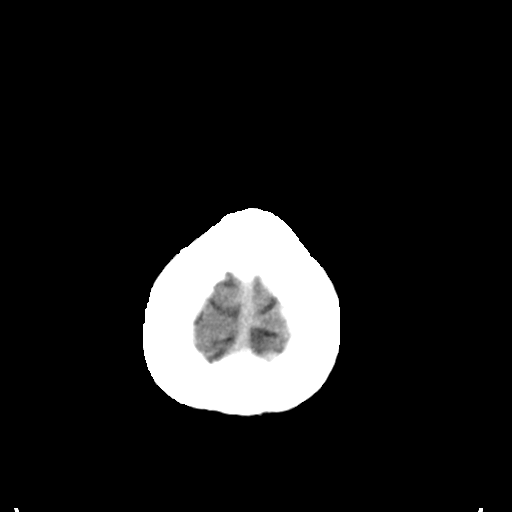
[im 30/33  bone]
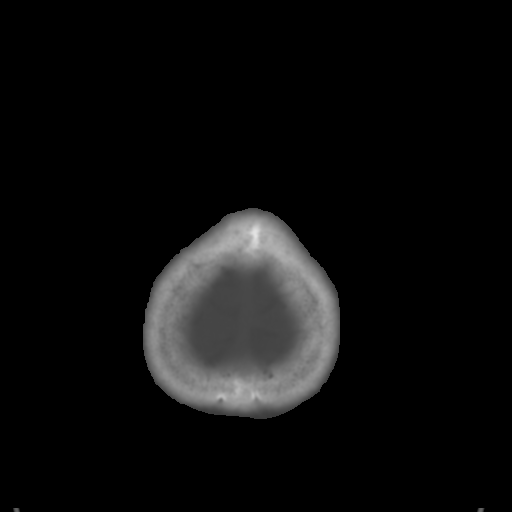

[Series 5: coronal soft tissue · coronal · 0.34mm/px · 3 of 62 slices shown]
[im 21/62  brain]
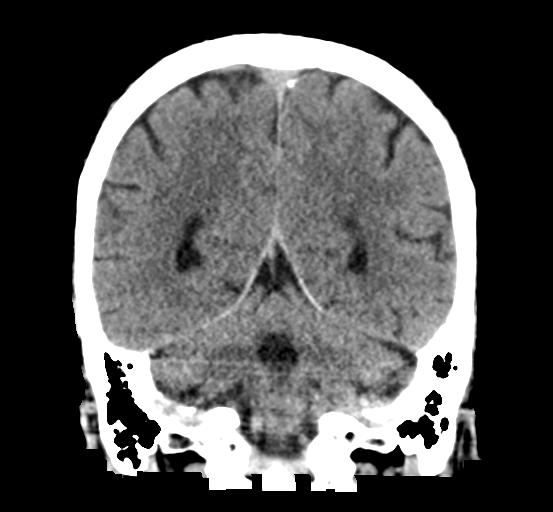
[im 28/62  brain]
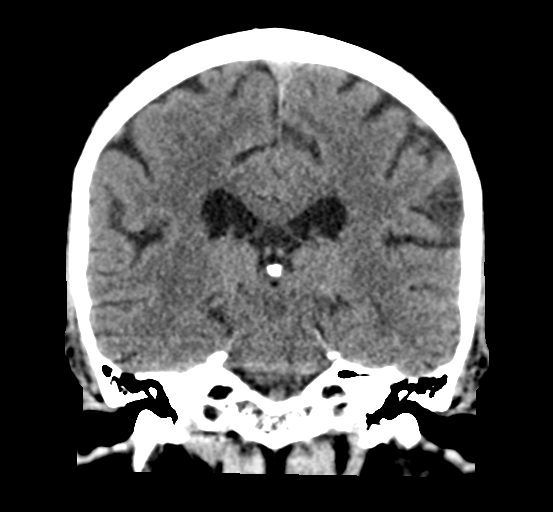
[im 34/62  brain]
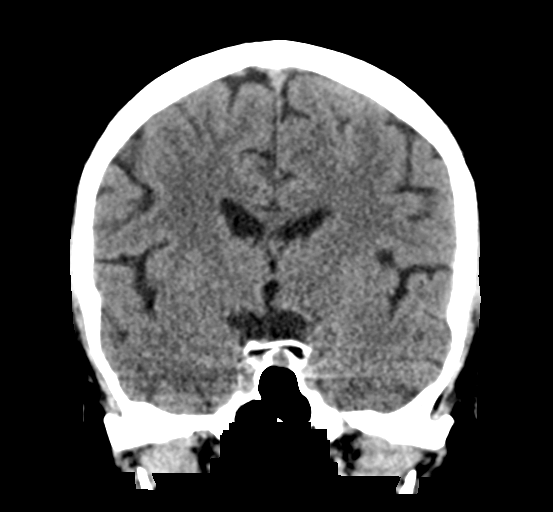

[Series 6: sagittal soft tissue · sagittal · 0.33mm/px · 3 of 49 slices shown]
[im 17/49  brain]
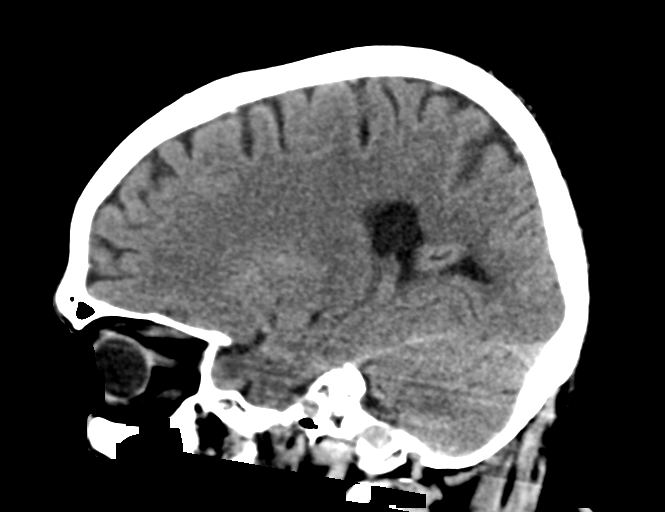
[im 25/49  brain]
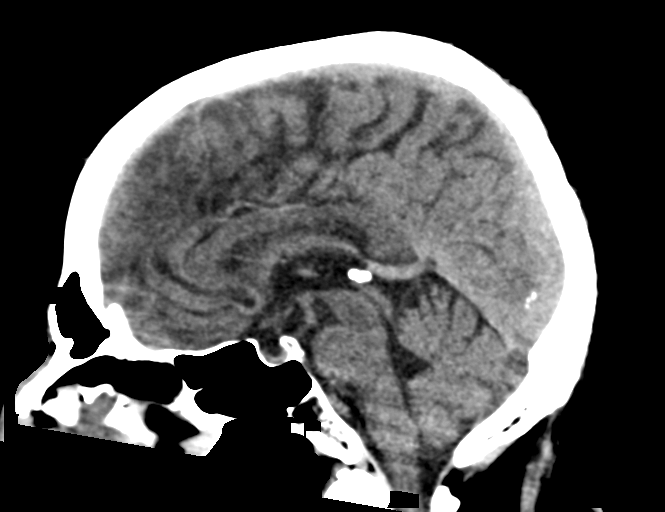
[im 33/49  brain]
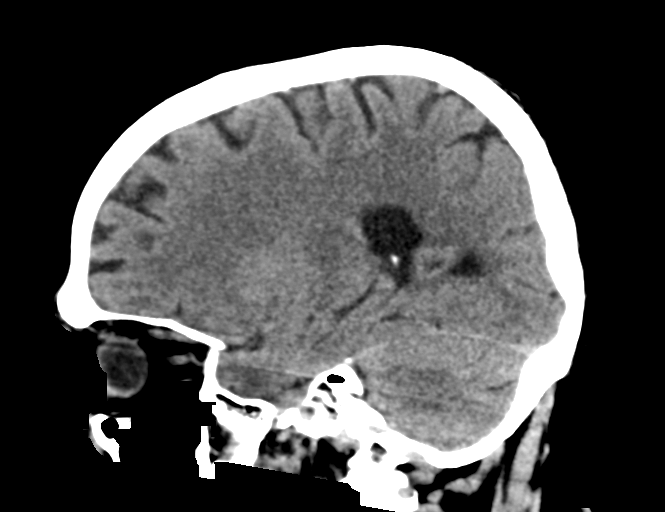

[15 of 47 positions shown; findings below may reference images not displayed]

FINDINGS: Brain: There is mild cerebral atrophy with widening of the
extra-axial spaces and ventricular dilatation.
There are areas of decreased attenuation within the white matter
tracts of the supratentorial brain, consistent with microvascular
disease changes.

Vascular: No hyperdense vessel or unexpected calcification.

Skull: Acute bilateral comminuted nasal bone fractures are seen with
moderate severity bilateral paranasal soft tissue swelling.
Negative for skull fracture or focal lesion.

Sinuses/Orbits: Chronic and postoperative changes are seen along the
medial walls of the bilateral maxillary sinuses. Mild diffuse
cortical thickening of the right maxillary sinus is also seen.

Other: None.
IMPRESSION: 1. Acute bilateral comminuted nasal bone fractures with moderate
severity bilateral paranasal soft tissue swelling.
2. No acute intracranial abnormality.

## 2021-07-09 IMAGING — CT CT MAXILLOFACIAL W/O CM
3 series · 16 of 47 positions shown, 19 images · non-contrast
Comparison: None.

CLINICAL DATA: Status post trauma.

EXAM:
CT MAXILLOFACIAL WITHOUT CONTRAST
TECHNIQUE: Multidetector CT imaging of the maxillofacial structures was
performed. Multiplanar CT image reconstructions were also generated.

[Series 3: max soft · axial · 0.36mm/px · z∈[-236,-84]mm · 10 of 90 slices shown, 13 images]
[im 7/90  brain]
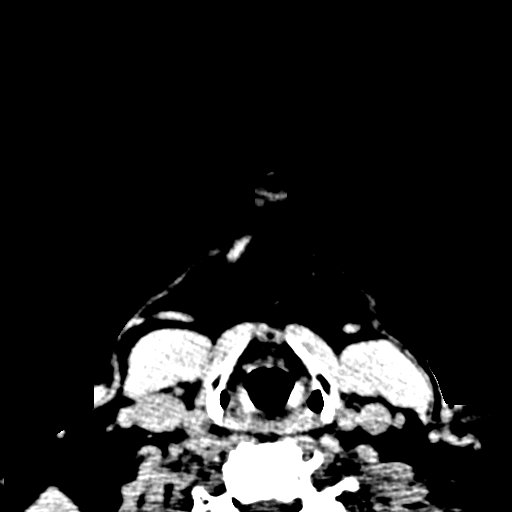
[im 7/90  bone]
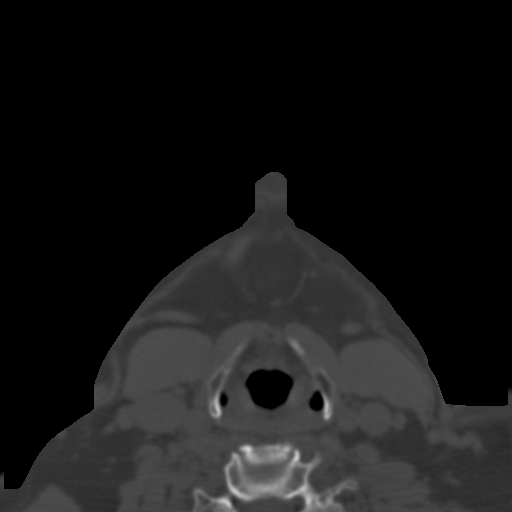
[im 16/90  bone]
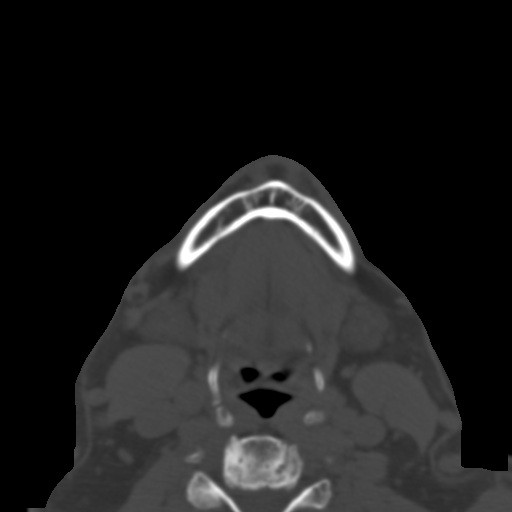
[im 25/90  bone]
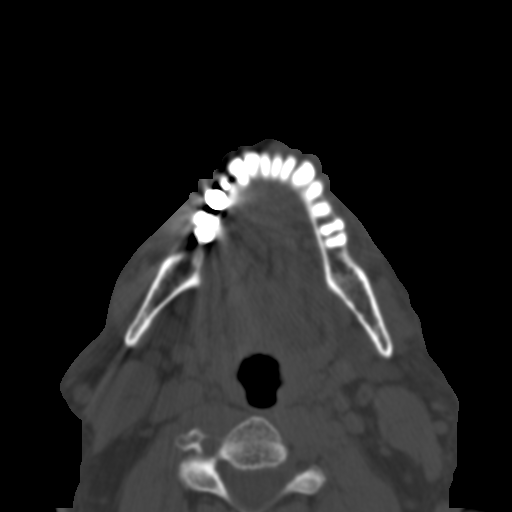
[im 31/90  bone]
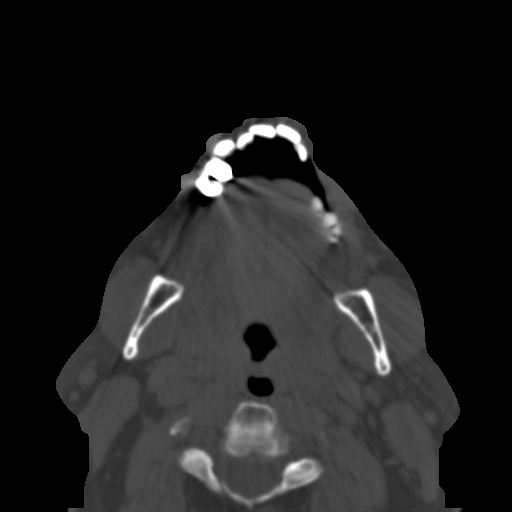
[im 40/90  brain]
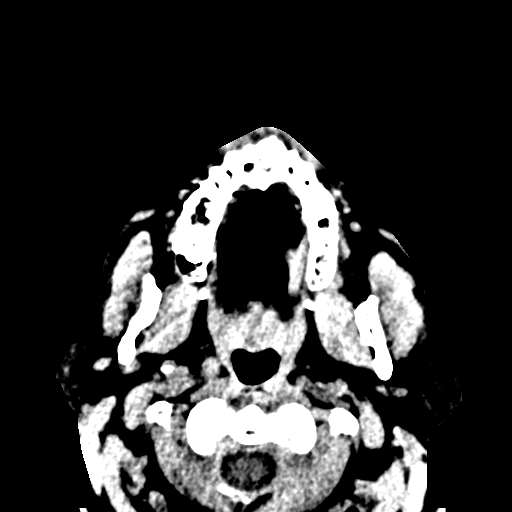
[im 40/90  bone]
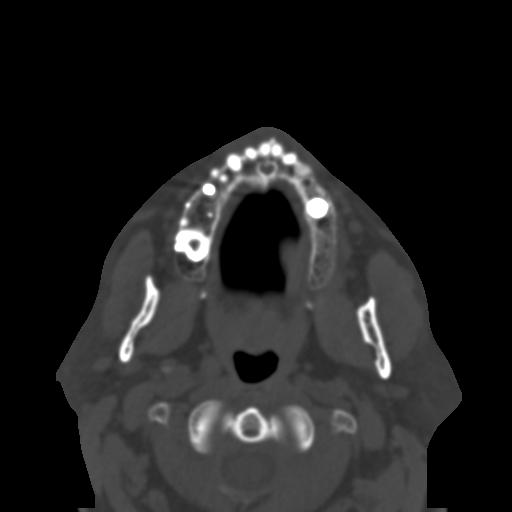
[im 50/90  bone]
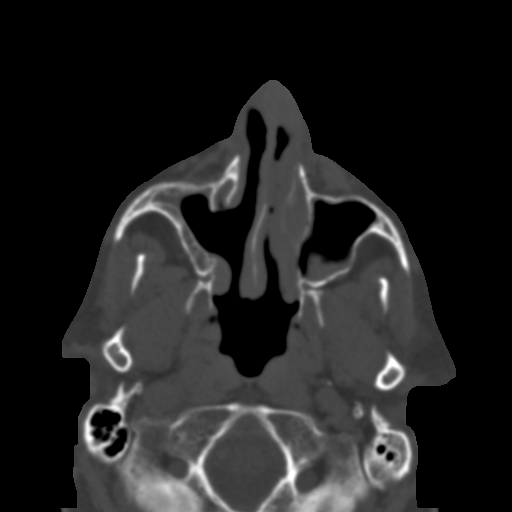
[im 59/90  bone]
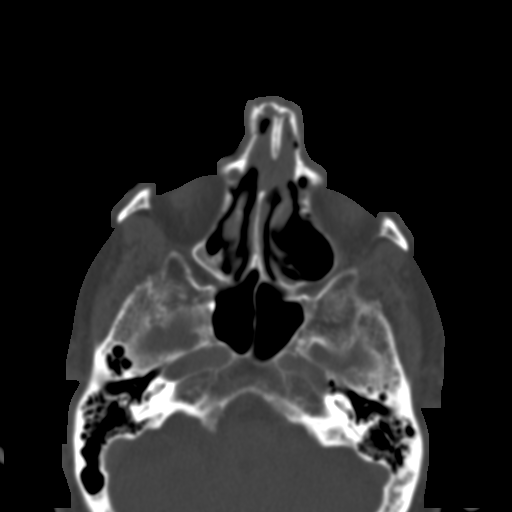
[im 68/90  bone]
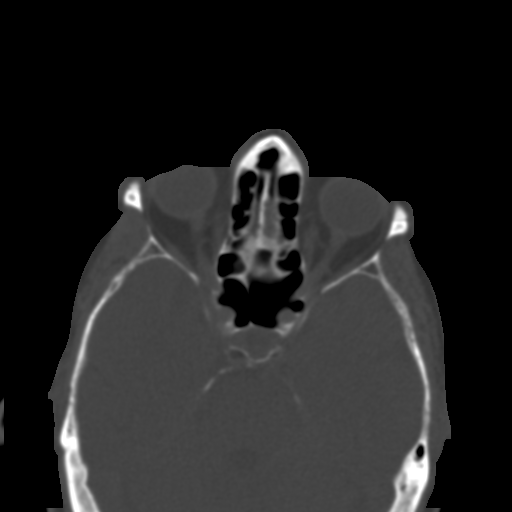
[im 74/90  brain]
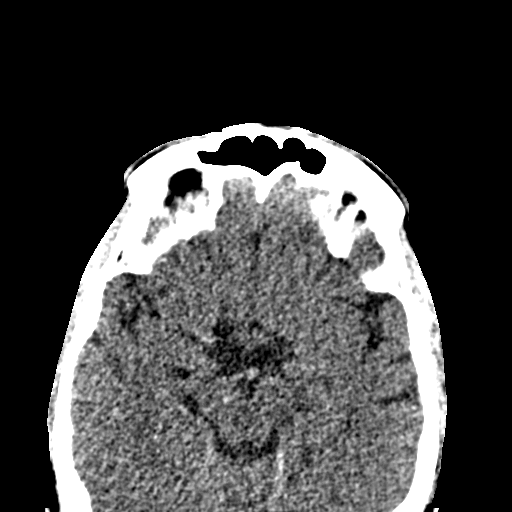
[im 74/90  bone]
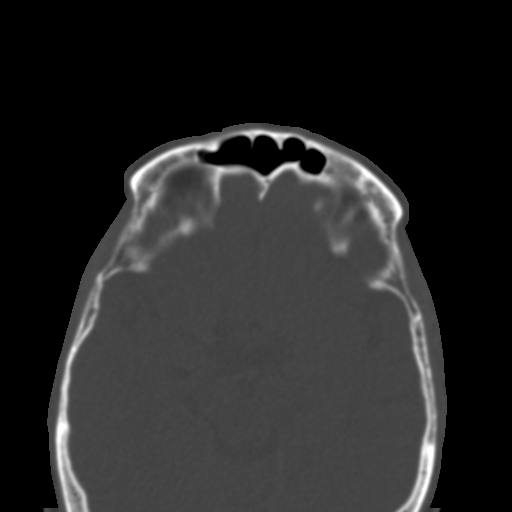
[im 83/90  bone]
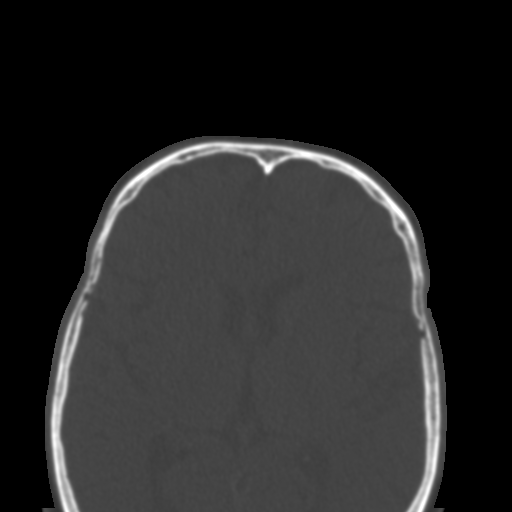

[Series 7: coronal soft · coronal · 0.37mm/px · 3 of 90 slices shown]
[im 30/90  bone]
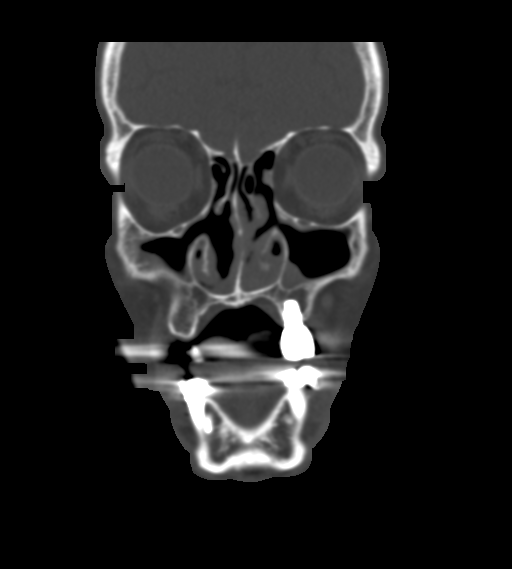
[im 40/90  bone]
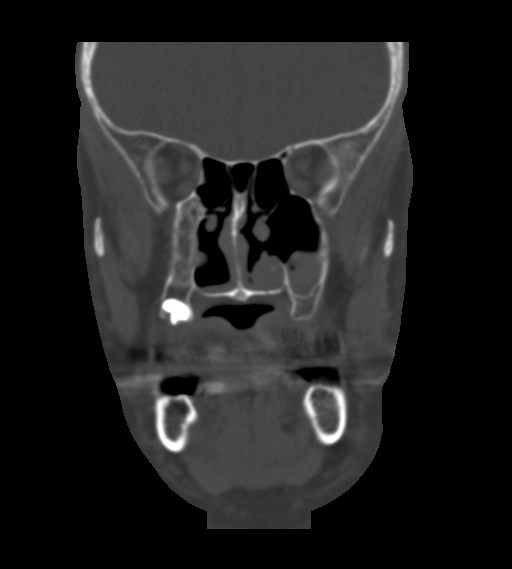
[im 50/90  bone]
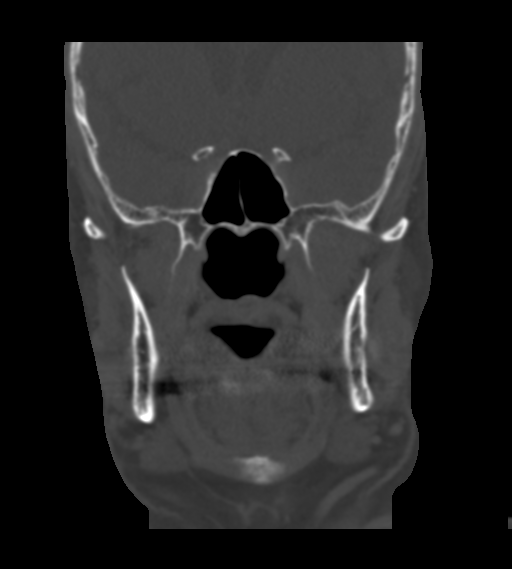

[Series 8: sagittal soft · sagittal · 0.37mm/px · 3 of 83 slices shown]
[im 28/83  bone]
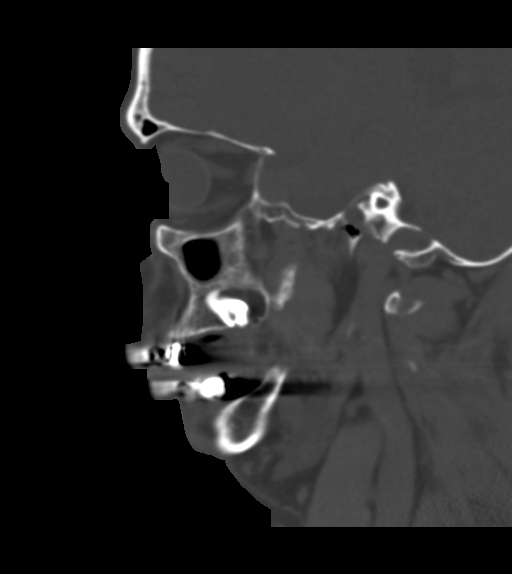
[im 42/83  bone]
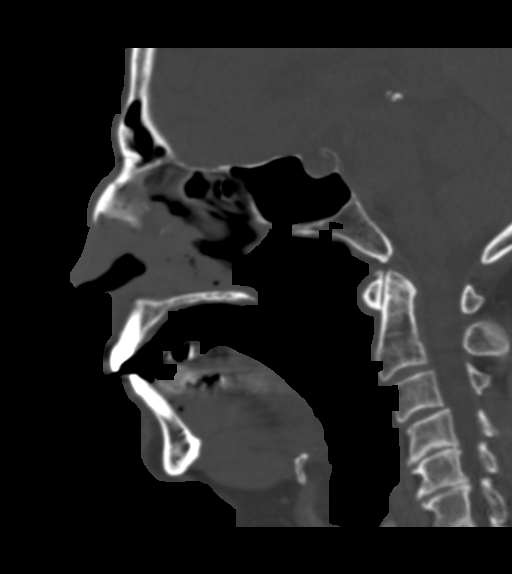
[im 55/83  bone]
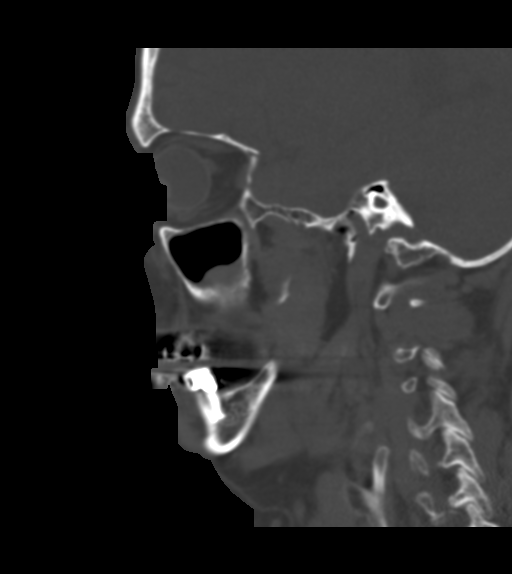

[16 of 47 positions shown; findings below may reference images not displayed]

FINDINGS: Osseous: Acute bilateral comminuted nasal bone fractures are seen.

Orbits: Negative. No traumatic or inflammatory finding.

Sinuses: Chronic and postoperative changes are seen along the medial
walls of the bilateral maxillary sinuses with mild to moderate
severity left maxillary sinus mucosal thickening. Chronic diffuse
cortical thickening of the right maxillary sinus is also noted.

Soft tissues: There is moderate severity bilateral paranasal soft
tissue swelling, left greater than right.

Limited intracranial: No significant or unexpected finding.
IMPRESSION: 1. Acute bilateral comminuted nasal bone fractures.
2. Chronic and postoperative changes of the bilateral maxillary
sinuses.
3. Moderate severity bilateral paranasal soft tissue swelling, left
greater than right.
